# Patient Record
Sex: Male | Born: 1970 | Race: Black or African American | Hispanic: No | Marital: Married
Health system: Northeastern US, Academic
[De-identification: ages and names within clinical notes are randomized; demographics above are authoritative.]

---

## 2018-09-05 IMAGING — US US ABDOMEN COMPLETE
1 series · 14 of 25 positions shown · non-contrast
Comparison: None.

HISTORY: Abdominal pain.
TECHNIQUE: Multiple sonographic images of the abdomen were obtained, complete.

[Series 1: us abdomen complete · 14 of 55 slices shown]
[im 1/55]
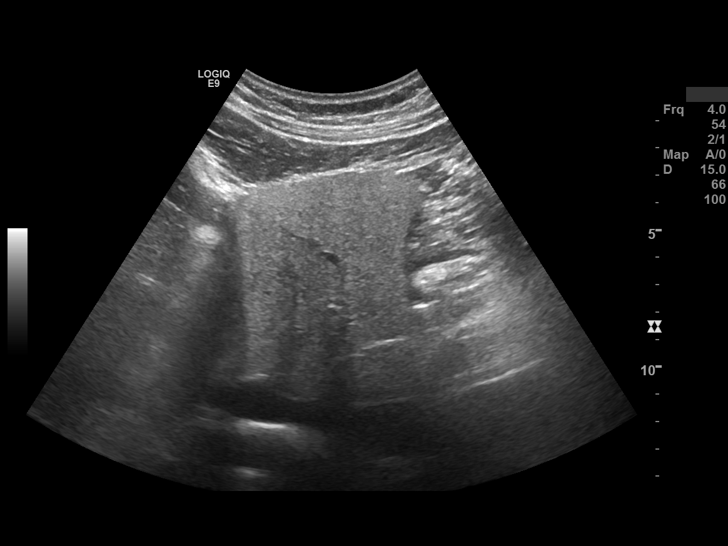
[im 5/55]
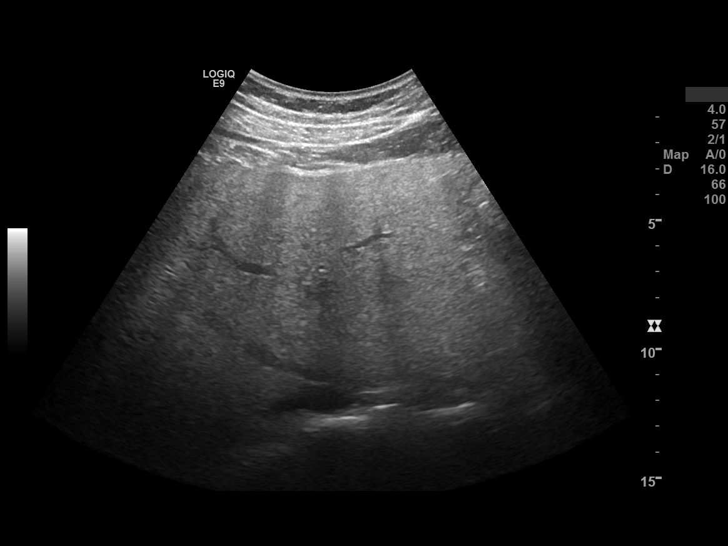
[im 10/55]
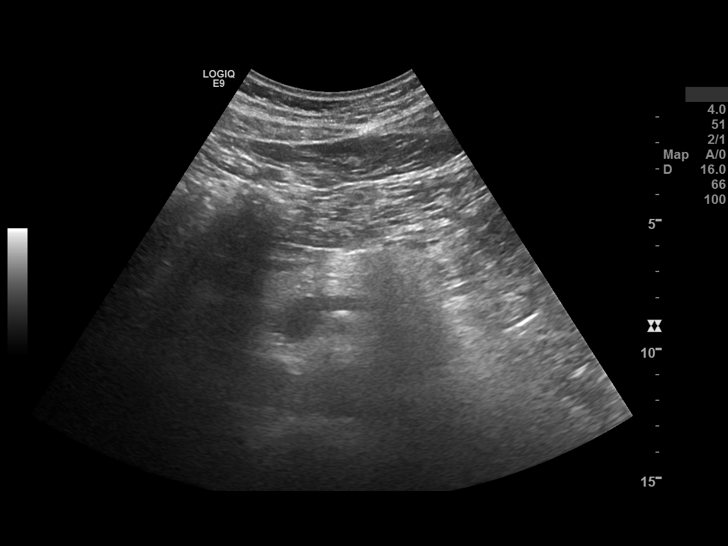
[im 14/55]
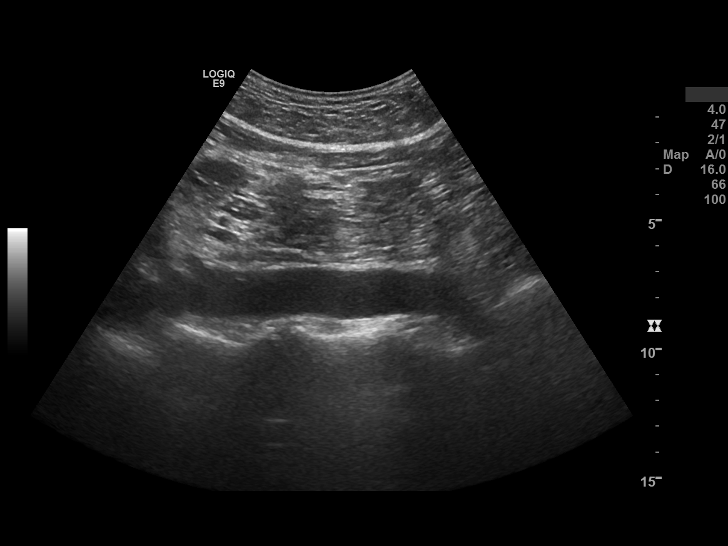
[im 19/55]
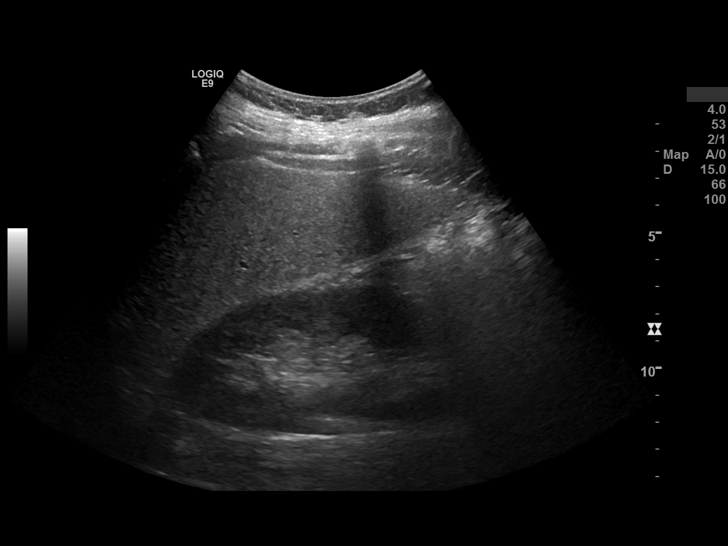
[im 21/55]
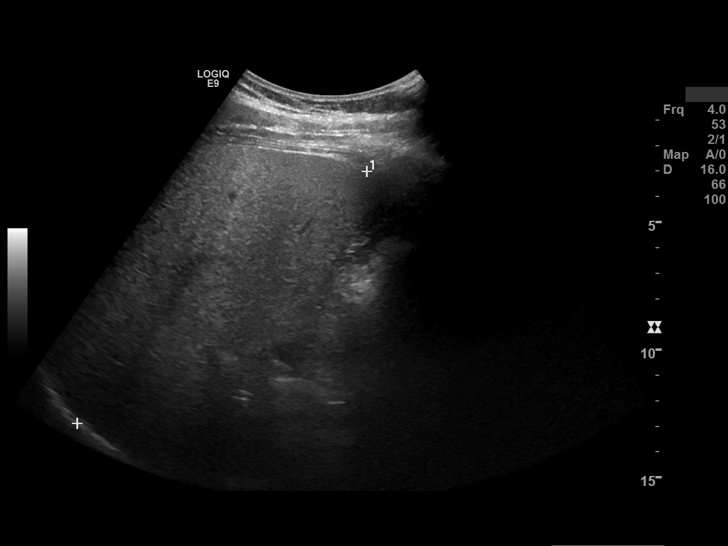
[im 25/55]
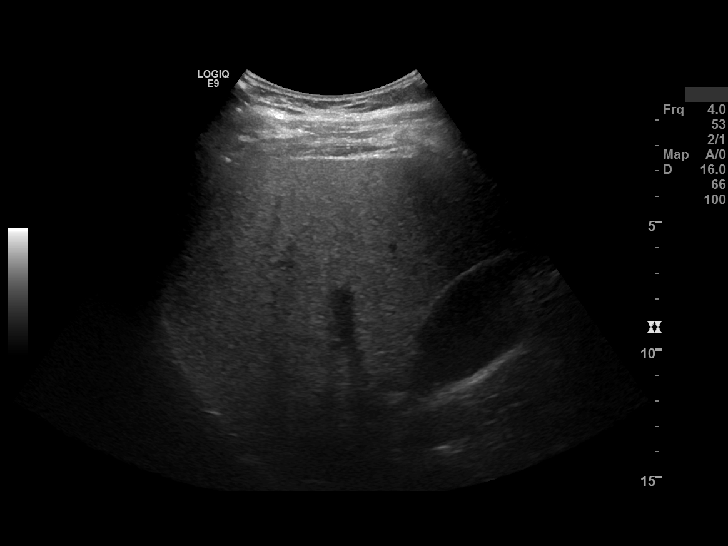
[im 30/55]
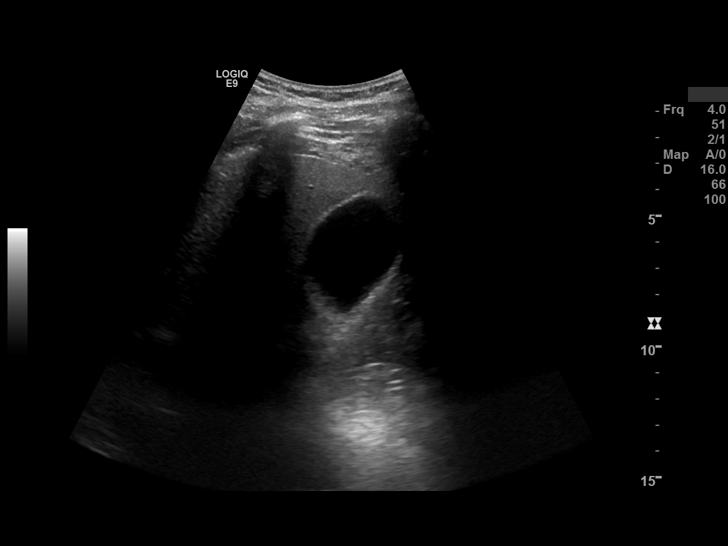
[im 34/55]
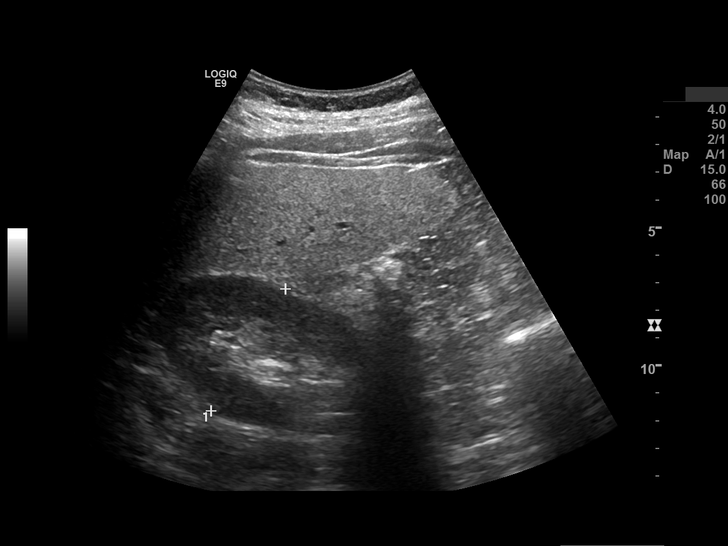
[im 37/55]
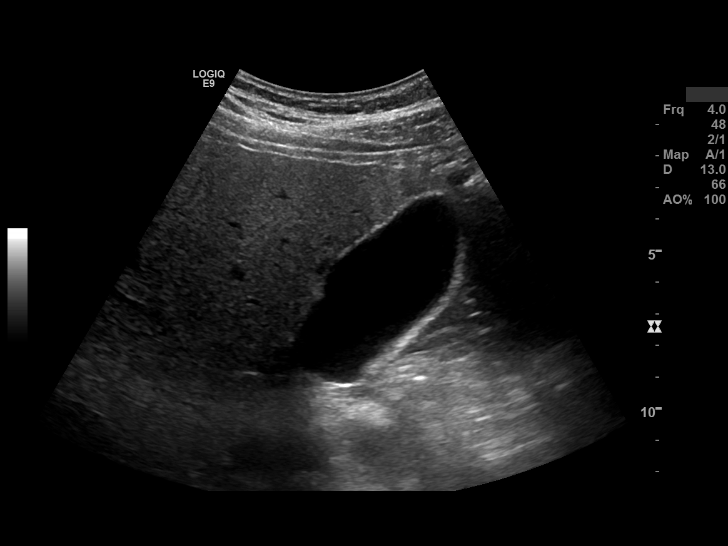
[im 41/55]
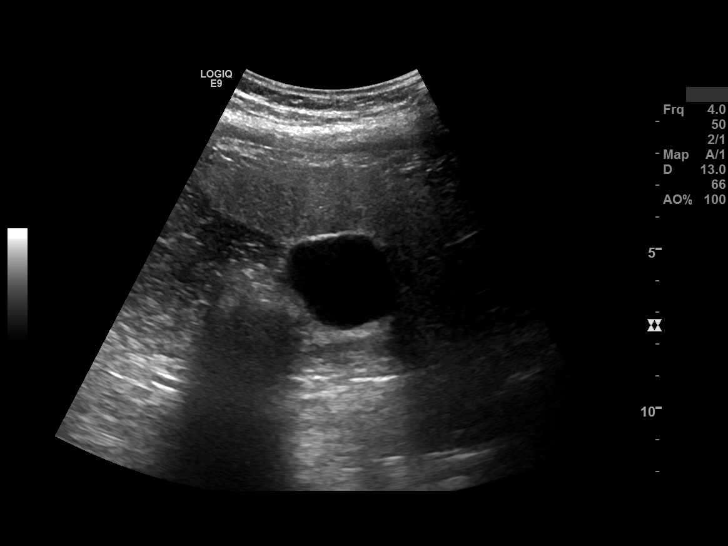
[im 46/55]
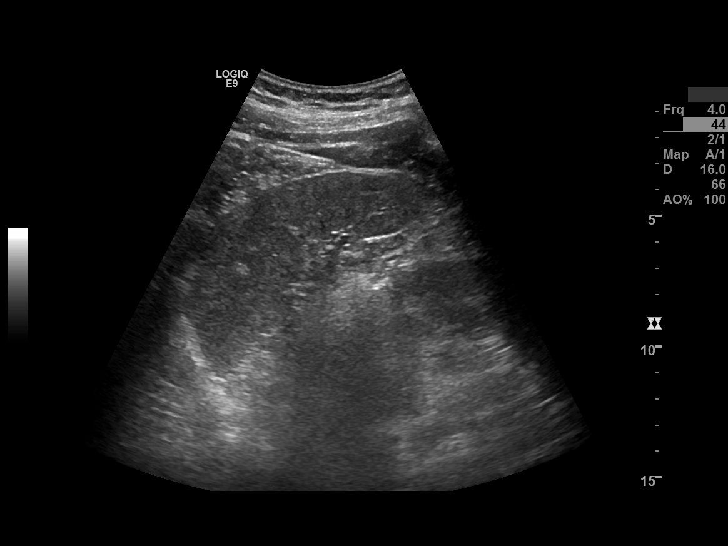
[im 50/55]
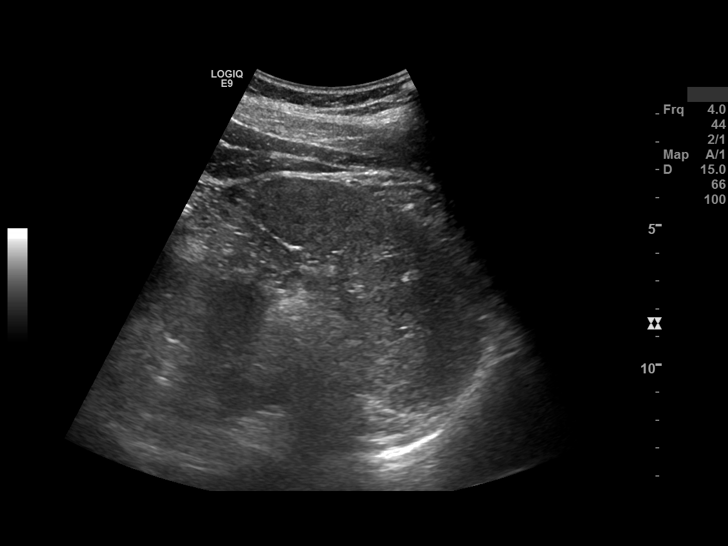
[im 55/55]
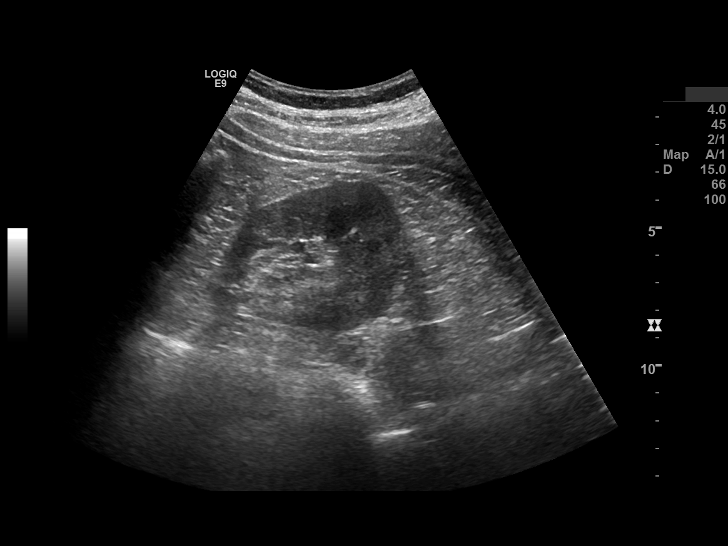

[14 of 25 positions shown; findings below may reference images not displayed]

FINDINGS: Pancreas is unremarkable. Visualized aorta and IVC appear within normal limits. Liver is diffusely hyperechoic with no evidence of mass or intrahepatic biliary ductal dilatation. Normal hepatopetal flow within the portal vein. Gallbladder is unremarkable. Common bile duct measures 0.3 cm, within normal limits. Spleen is unremarkable measuring 9.8 x 4.4 cm. Right kidney measures 12.4 x 6.0 cm. Left kidney measures 11.1 x 5.4 cm. No suspicious renal mass. No hydronephrosis. Single 3.0 cm simple cyst in the right kidney.
IMPRESSION: 1. Hepatic steatosis.

2. Single 3.0 cm simple cyst in the right kidney.

## 2019-01-01 IMAGING — US US SCROTUM W/DOPPLER SCROTUM
1 series · 14 of 25 positions shown · non-contrast
Comparison: None.

HISTORY: Disorder of male genital organs. Bilateral scrotal pain.
TECHNIQUE: High-resolution ultrasound with color-flow Doppler.

[Series 1: us scrotum w/doppler scrotum · 14 of 52 slices shown]
[im 1/52]
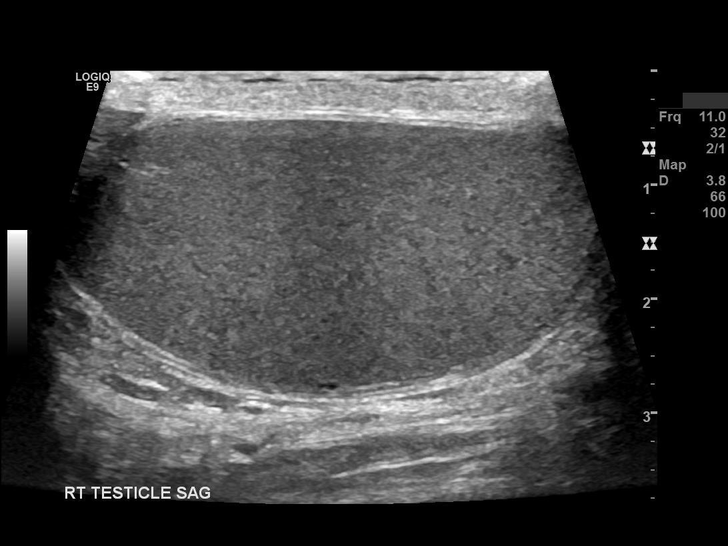
[im 5/52]
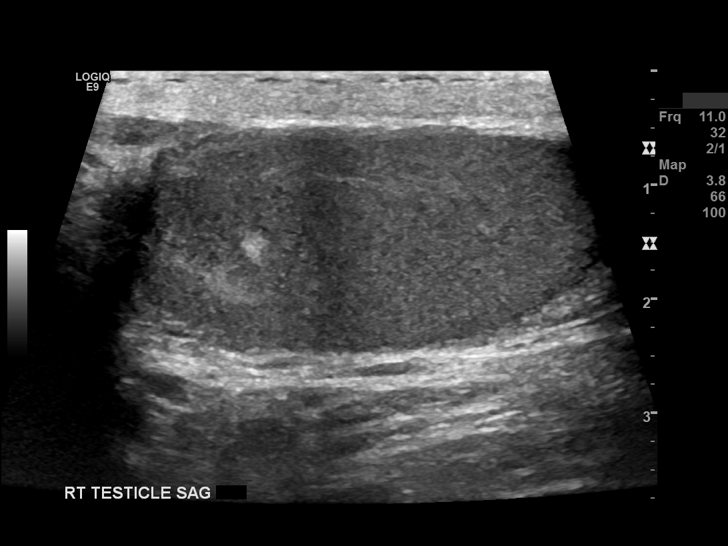
[im 9/52]
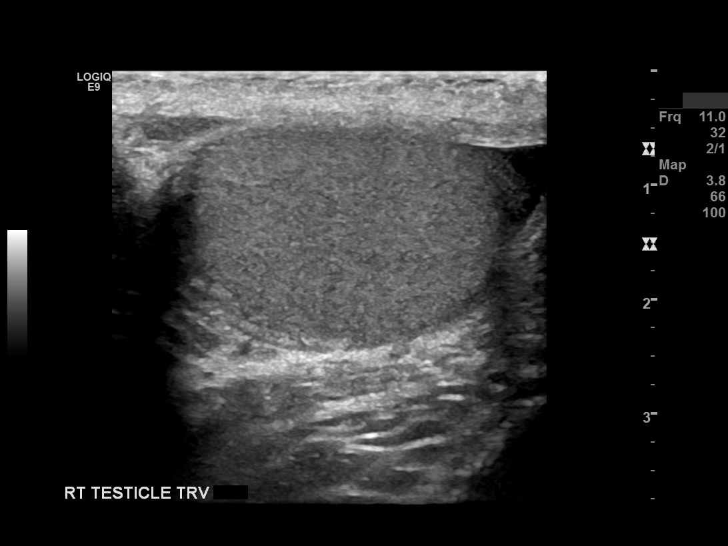
[im 13/52]
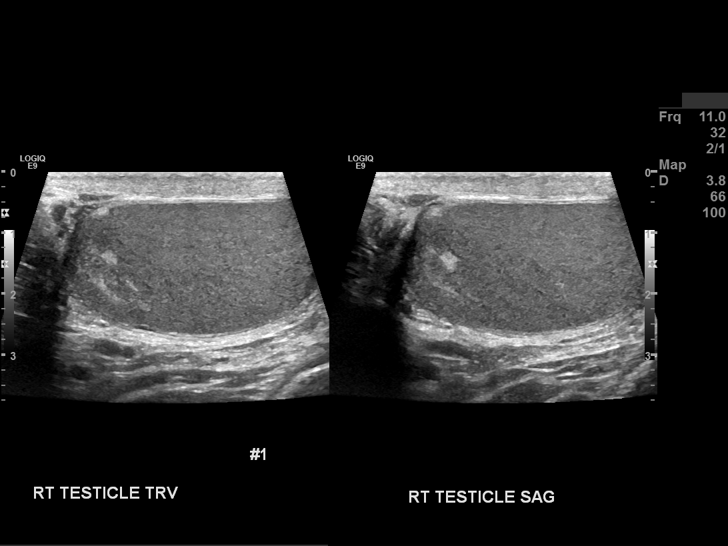
[im 18/52]
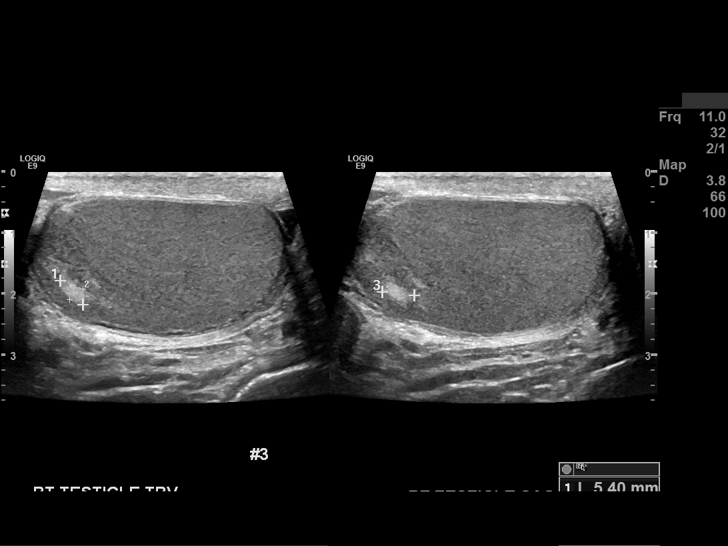
[im 20/52]
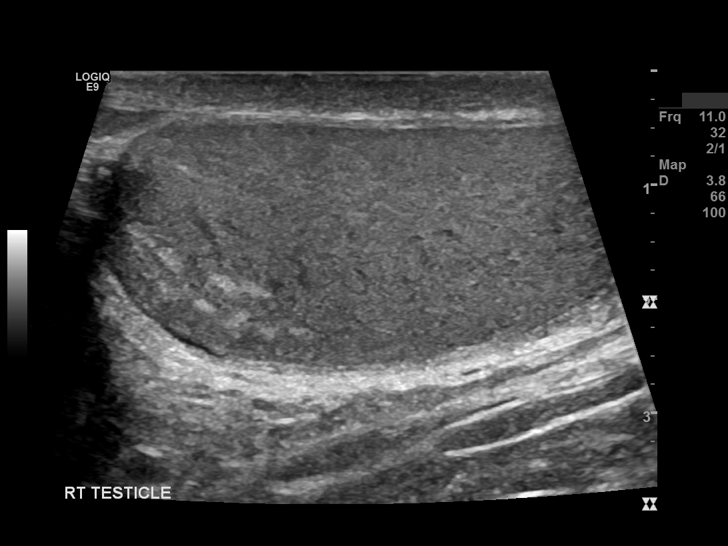
[im 24/52]
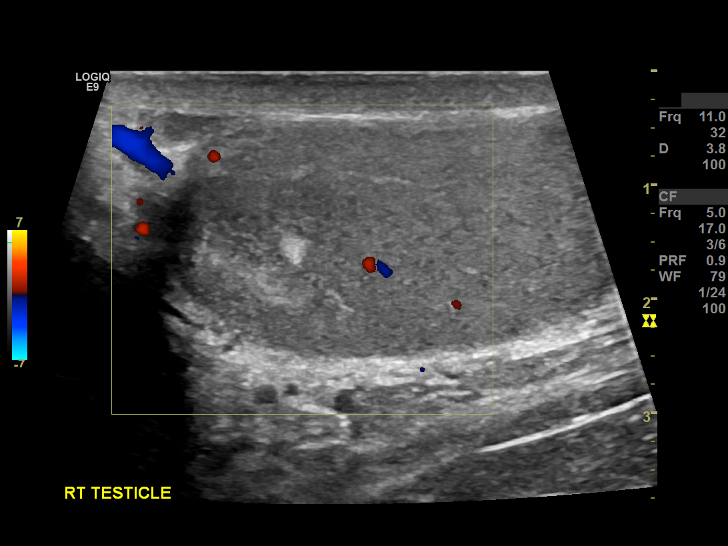
[im 28/52]
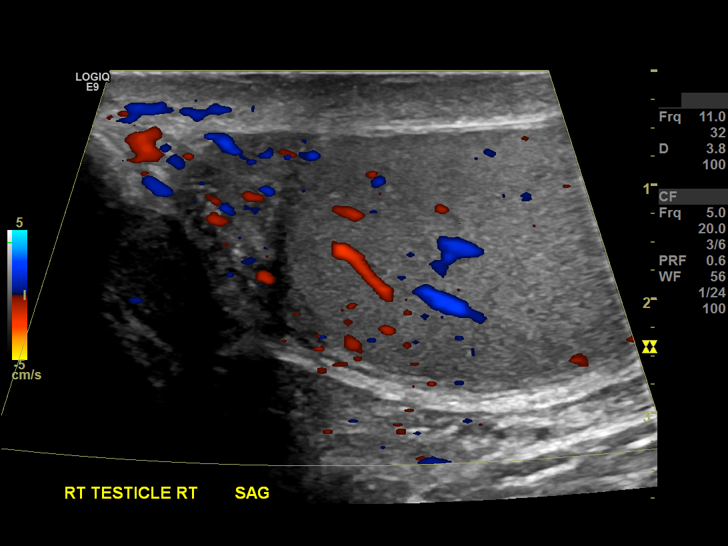
[im 32/52]
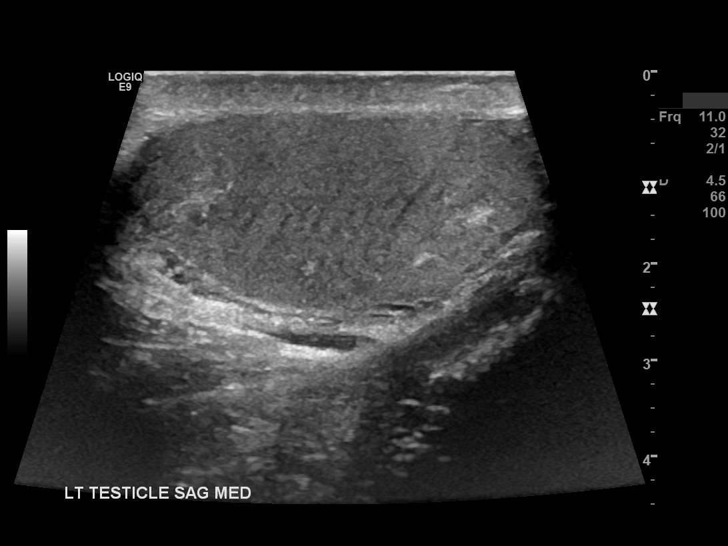
[im 35/52]
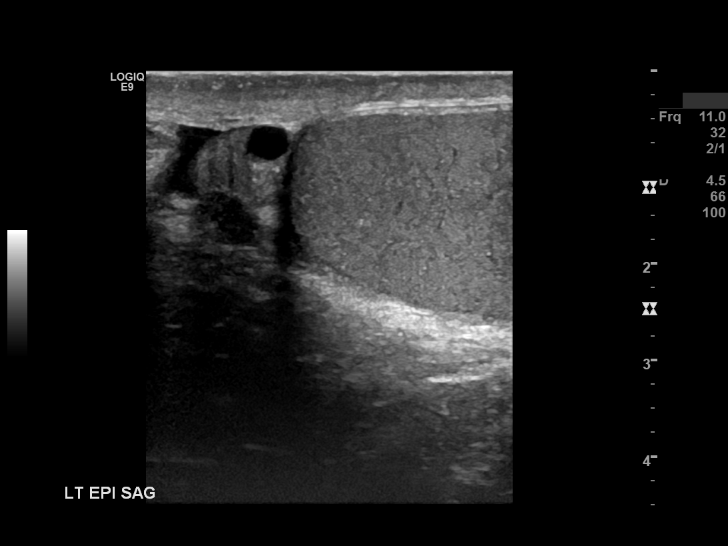
[im 39/52]
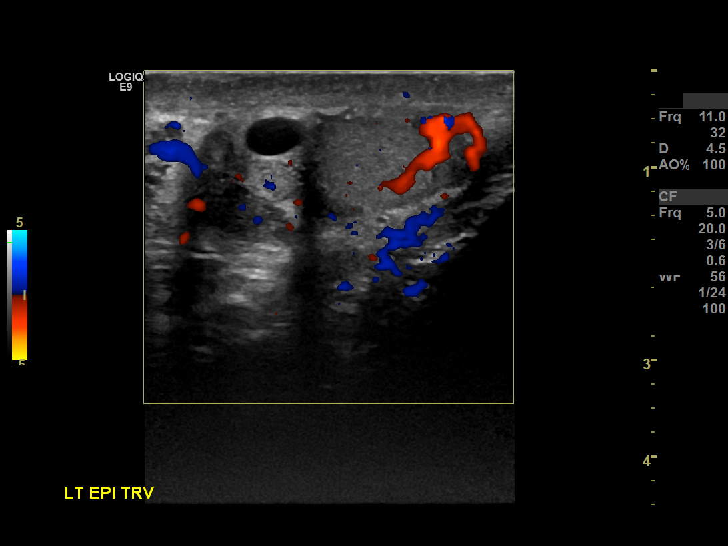
[im 43/52]
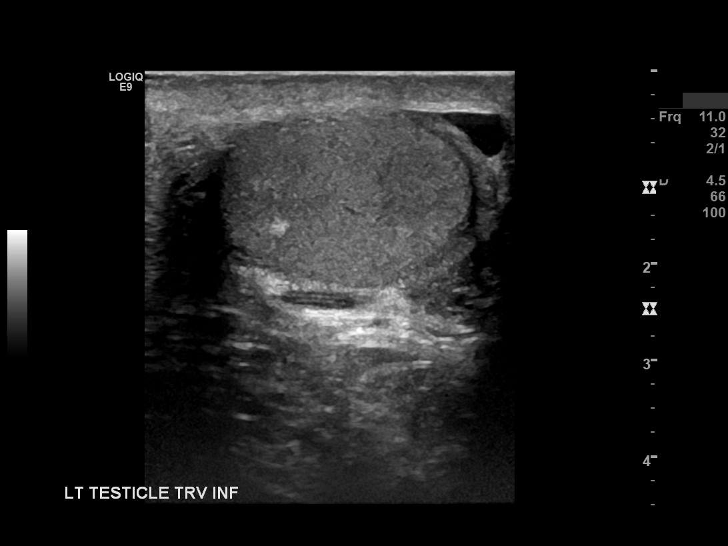
[im 47/52]
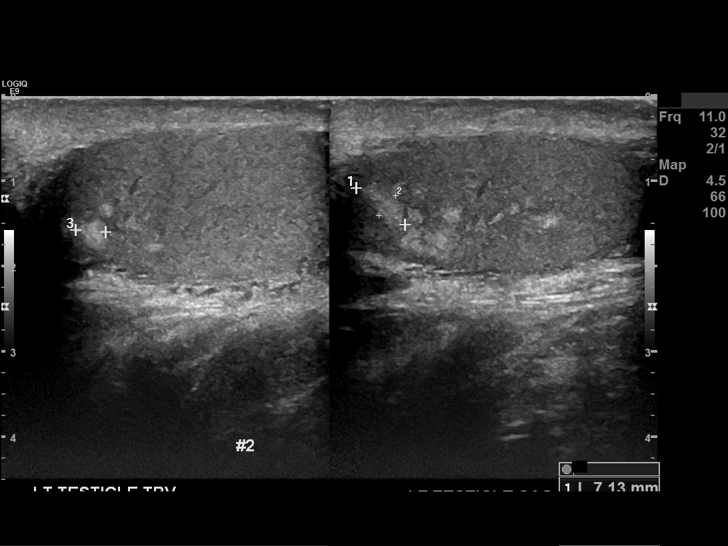
[im 52/52]
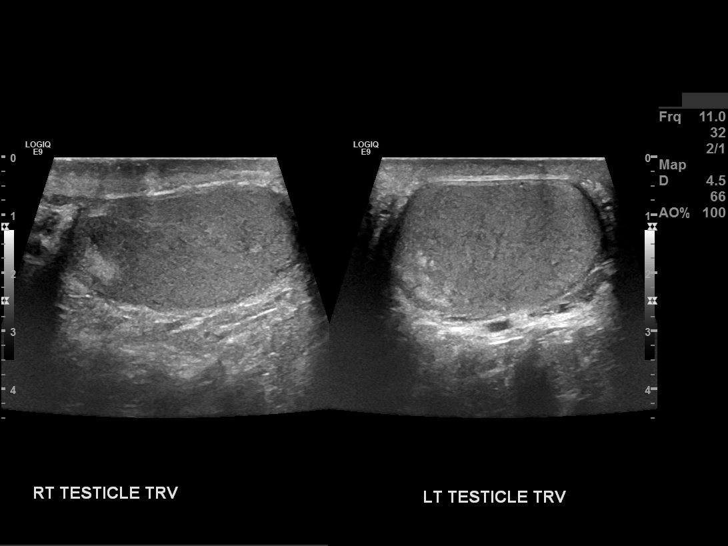

[14 of 25 positions shown; findings below may reference images not displayed]

FINDINGS: Right testicle: 47 x 25 x 40 mm. There is normal arterial and venous blood flow to the testicle. There is no testicular mass. There is heterogenous appearance of the testicular parenchyma with bands of hyperechoic appearance in fairly close proximity to the epididymis. Peak systolic velocity in the right testicular artery is 0.04 m/s. The epididymis is unremarkable except for a couple of small cysts. There is no hydrocele.

Left testicle: 50 x 28 x 31 mm. There is a small cyst in the head of the epididymis. 6 x 4 x 5 mm. There are bands of hyperechogenicity in the left testicle again more prominent in close proximity to the epididymis. There is normal arterial and venous blood flow. Peak systolic velocity in the left testicular artery is 0.04 m/s. There is no hydrocele.
IMPRESSION: 1. Both testicles contain internal heterogenous hyperechoic areas that are most likely related to previous orchitis.

2. There is no evidence of epididymitis or hydrocele on either side.

## 2019-08-13 IMAGING — MR MRI BRAIN W/WO CONTRAST
9 series · 46 of 48 positions shown · IV contrast (prohance)
Comparison: None.

HISTORY: Other disorders of optic nerve, NEC, right eye; unspecified visual loss
TECHNIQUE: Multiplanar, multisequential MRI images of the brain are obtained prior to and following 15 mL ProHance intravenous contrast.

[Series 5: t1_sag · sagittal · 5.0mm · 0.72mm/px · 4 of 25 slices shown]
[im 1/25]
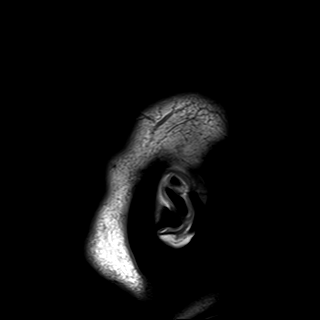
[im 9/25]
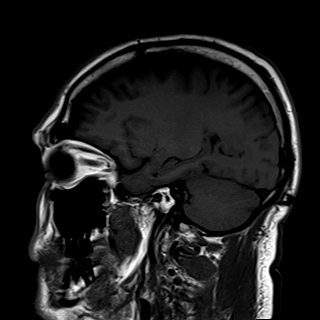
[im 17/25]
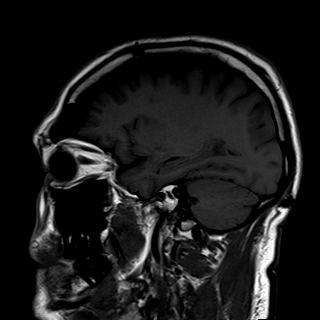
[im 25/25]
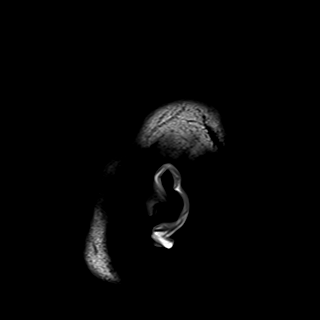

[Series 6: flair_axial_fs · axial · 4.0mm · 0.90mm/px · z∈[-41,+110]mm · 5 of 30 slices shown]
[im 1/30]
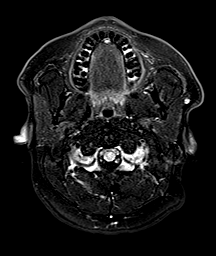
[im 8/30]
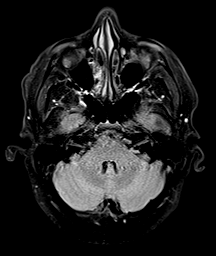
[im 15/30]
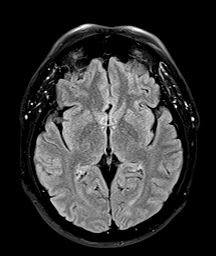
[im 22/30]
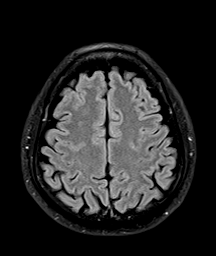
[im 30/30]
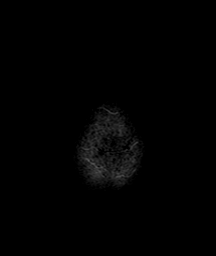

[Series 7: t2_axial · axial · 4.0mm · 0.36mm/px · z∈[-41,+110]mm · 6 of 30 slices shown]
[im 1/30]
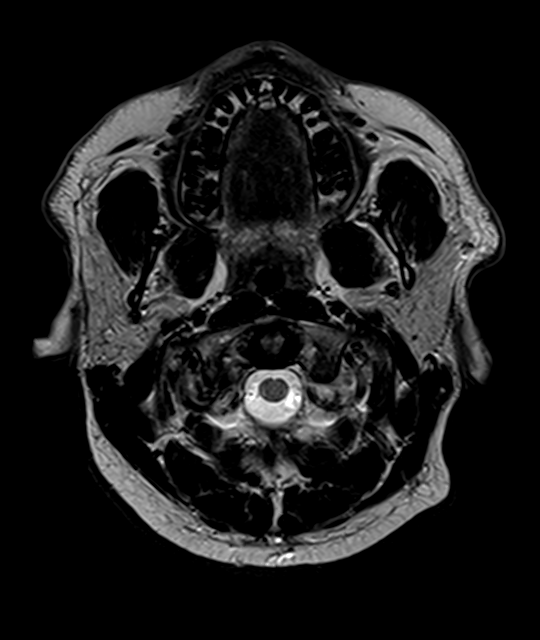
[im 6/30]
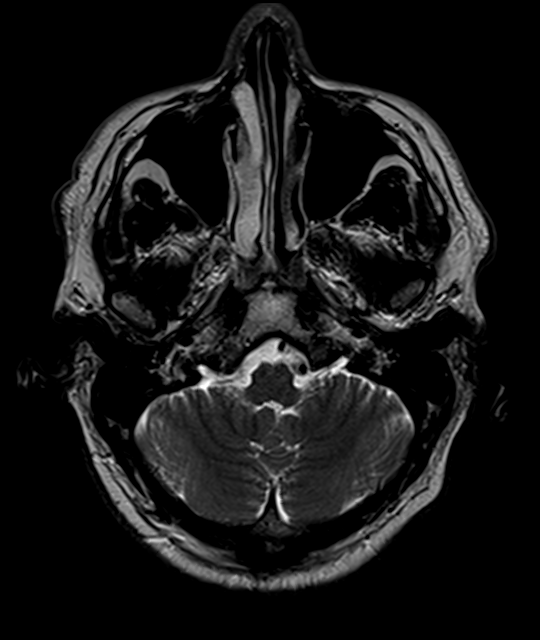
[im 12/30]
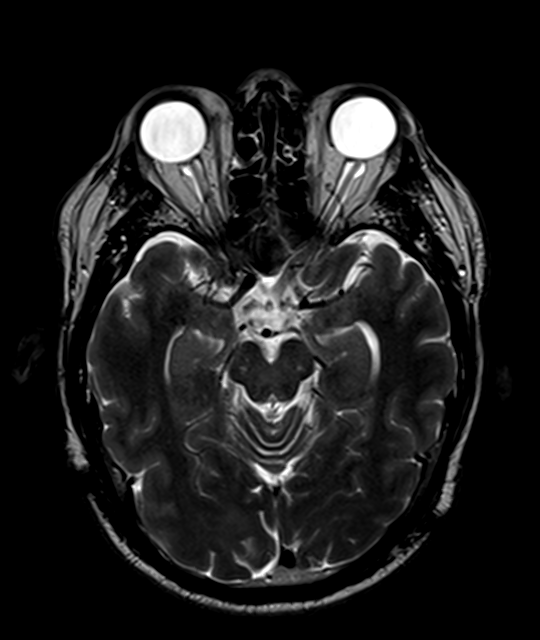
[im 18/30]
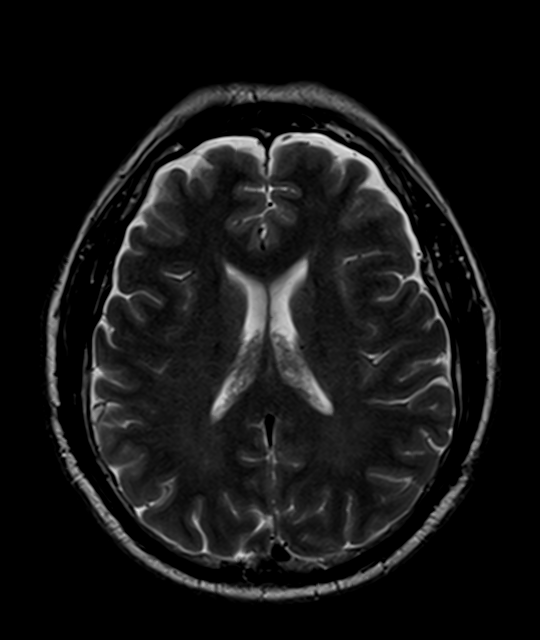
[im 24/30]
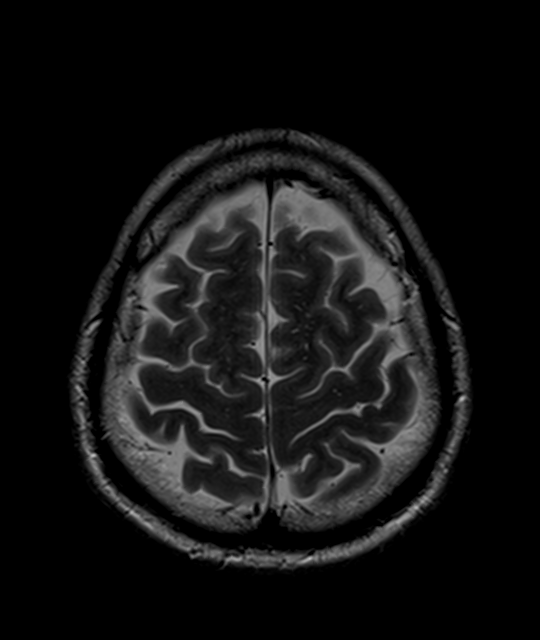
[im 30/30]
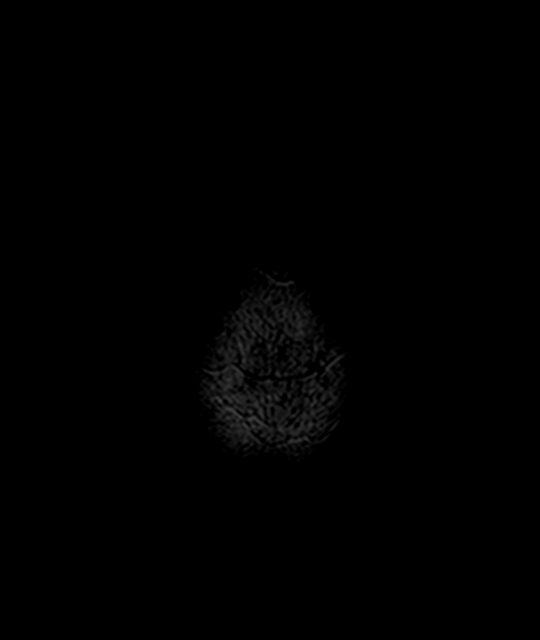

[Series 8: t1_axial · axial · 4.0mm · 0.72mm/px · z∈[-41,+110]mm · 6 of 30 slices shown]
[im 1/30]
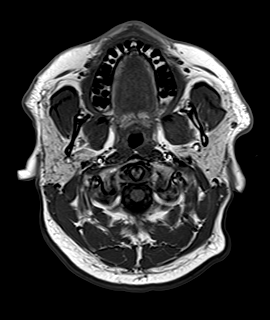
[im 6/30]
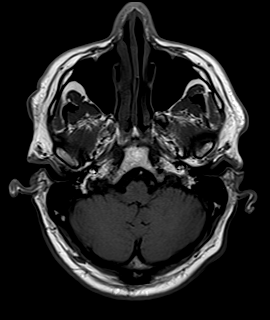
[im 12/30]
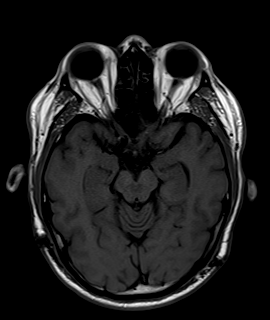
[im 18/30]
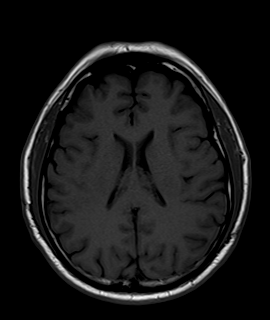
[im 24/30]
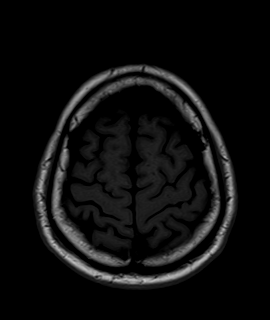
[im 30/30]
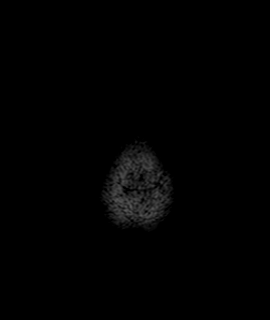

[Series 9: DWI · axial · 4.0mm · 1.31mm/px · z∈[-35,+110]mm · 5 of 29 slices shown (1 of 2)]
[im 1/29]
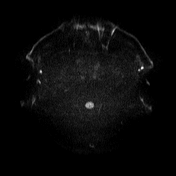
[im 8/29]
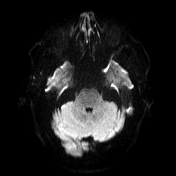
[im 15/29]
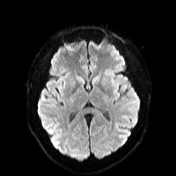
[im 22/29]
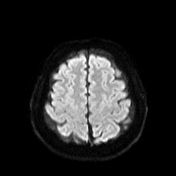
[im 29/29]
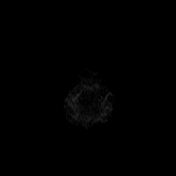

[Series 10: DWI · axial · 4.0mm · 1.31mm/px · z∈[-41,+110]mm · 5 of 29 slices shown (2 of 2)]
[im 1/29]
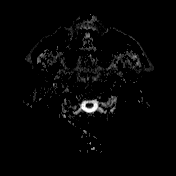
[im 8/29]
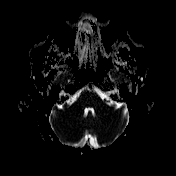
[im 15/29]
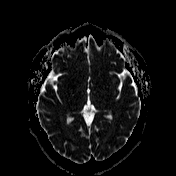
[im 22/29]
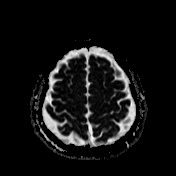
[im 29/29]
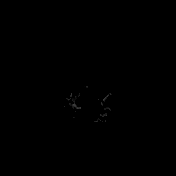

[Series 11: flash_axial · axial · 4.0mm · 0.90mm/px · z∈[-41,+110]mm · 6 of 30 slices shown]
[im 1/30]
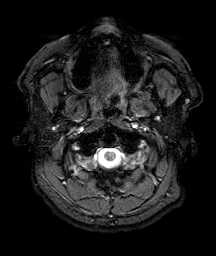
[im 6/30]
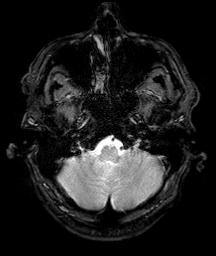
[im 12/30]
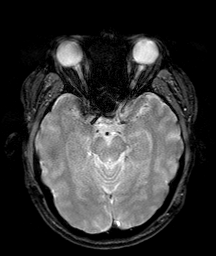
[im 18/30]
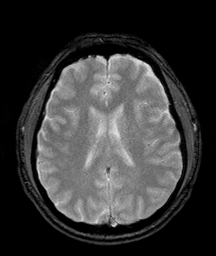
[im 24/30]
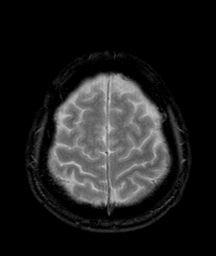
[im 30/30]
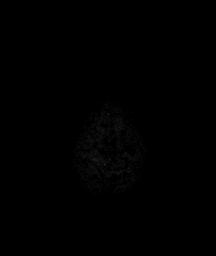

[Series 12: t1_axial+c · axial · 4.0mm · 0.72mm/px · z∈[-41,+110]mm · 6 of 30 slices shown]
[im 1/30]
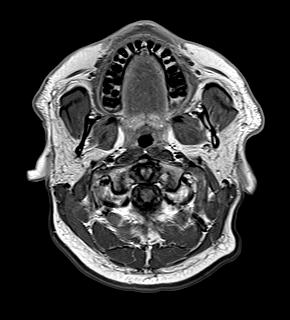
[im 6/30]
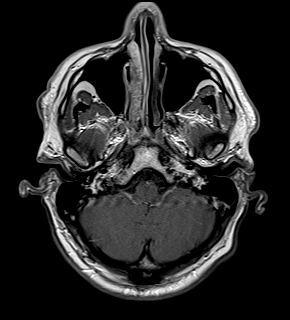
[im 12/30]
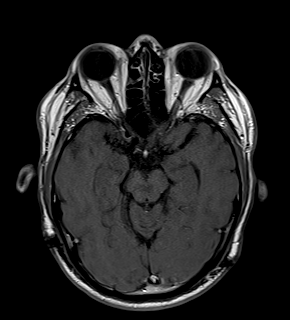
[im 18/30]
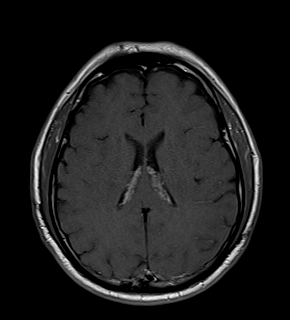
[im 24/30]
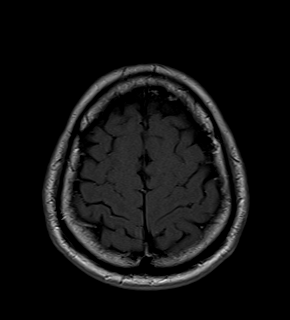
[im 30/30]
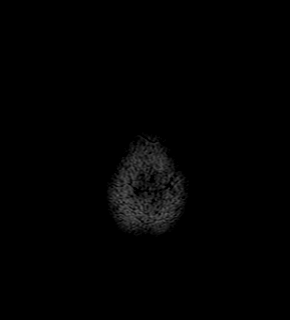

[Series 13: t1_cor_fs_+c · coronal · 5.0mm · 0.72mm/px · 3 of 26 slices shown]
[im 1/26]
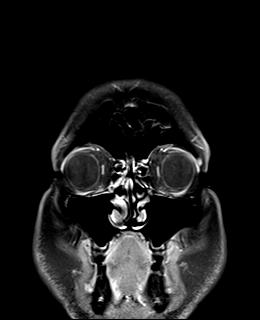
[im 7/26]
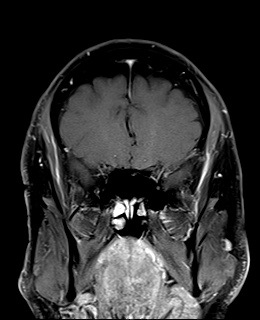
[im 13/26]
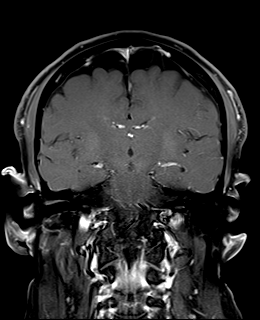

[46 of 48 positions shown; findings below may reference images not displayed]

FINDINGS: There is mild prominence of ventricles and cortical sulci. There is no abnormal intra-axial signal. Midline structures are normal. Flow voids of main intracranial vasculature are normal. There is no mass lesion or midline shift. No extra-axial fluid collection is identified. No area of restricted diffusion, hemosiderin deposit or abnormal enhancement is identified. Mucosal thickening of ethmoid air cells and frontal sinuses seen. The rest of the orbits, paranasal sinuses, mastoid air cells and skull marrow signal are normal. Optic tracts, optic chiasma and optic nerves are grossly unremarkable at the visualized portions.
IMPRESSION: Mild cerebral atrophy or involutional changes identified.

No acute intracranial pathology.

Chronic paranasal sinusitis seen.

## 2019-11-30 ENCOUNTER — Inpatient Hospital Stay: Admit: 2019-11-30 | Discharge: 2019-11-30 | Payer: MEDICAID | Primary: Internal Medicine

## 2019-11-30 ENCOUNTER — Encounter: Admit: 2019-11-30 | Payer: PRIVATE HEALTH INSURANCE | Attending: Internal Medicine | Primary: Internal Medicine

## 2019-11-30 DIAGNOSIS — M79672 Pain in left foot: Secondary | ICD-10-CM

## 2019-12-17 ENCOUNTER — Encounter: Admit: 2019-12-17 | Payer: PRIVATE HEALTH INSURANCE | Attending: Surgery | Primary: Internal Medicine

## 2019-12-17 ENCOUNTER — Ambulatory Visit: Admit: 2019-12-17 | Payer: MEDICAID | Attending: Surgery | Primary: Internal Medicine

## 2019-12-17 DIAGNOSIS — K4020 Bilateral inguinal hernia, without obstruction or gangrene, not specified as recurrent: Secondary | ICD-10-CM

## 2019-12-17 MED ORDER — ATORVASTATIN 10 MG TABLET
10 mg | Freq: Every day | ORAL | Status: AC
Start: 2019-12-17 — End: 2020-02-17

## 2019-12-17 NOTE — H&P
History of Present Illness:  Aaron Irwin is a 49 y.o. male who presents with discomfort and lumps in both groins.  He has noted the discomfort in the groins over the past six weeks.  He is eating well and having normal BMs.  His father is currently being investigated for hernias.  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 Scar   Past Surgical History: Procedure Laterality Date ? KNEE ARTHROCENTESIS    No Known Allergies Social History Socioeconomic History ? Marital status: Married   Spouse name: Not on file ? Number of children: Not on file ? Years of education: Not on file ? Highest education level: Not on file Occupational History ? Not on file Social Needs ? Financial resource strain: Not on file ? Food insecurity   Worry: Not on file   Inability: Not on file ? Transportation needs   Medical: Not on file   Non-medical: Not on file Tobacco Use ? Smoking status: Never Smoker Substance and Sexual Activity ? Alcohol use: Yes   Comment: social ? Drug use: No ? Sexual activity: Not on file Lifestyle ? Physical activity   Days per week: Not on file   Minutes per session: Not on file ? Stress: Not on file Relationships ? Social Manufacturing systems engineer on phone: Not on file   Gets together: Not on file   Attends religious service: Not on file   Active member of club or organization: Not on file   Attends meetings of clubs or organizations: Not on file   Relationship status: Not on file ? Intimate partner violence   Fear of current or ex partner: Not on file   Emotionally abused: Not on file   Physically abused: Not on file   Forced sexual activity: Not on file Other Topics Concern ? Not on file Social History Narrative ? Not on file  Current Outpatient Medications on File Prior to Visit Medication Sig Dispense Refill ? atorvastatin (LIPITOR) 10 mg tablet Take 10 mg by mouth daily.   ? ibuprofen (ADVIL,MOTRIN) 800 MG tablet Take 1 tablet (800 mg total) by mouth every 8 (eight) hours as needed.. 20 tablet 0 No current facility-administered medications on file prior to visit.   Objective:Visit Vitals: There were no vitals taken for this visit.ROS: Complete ten system review of systems performed and documented with pertinent findings documented in the interval history.On Exam: Examination of the head and neck is unremarkableLungs clear to auscultation Cardiac regular rate and rythym, no murmurs Abdomen soft and not tender.  He has lumps in both groins that are reducibleExtremities warm without edemaNeurologically grossly intactImpression:Bilateral inguinal hernias.  I will fix these robotically.  The patient understands the indications for the surgery.  They also understand the risks that include but are not limited to recurrence, infection, hemorrhage, conversion to open, intestinal perforation and they consent.Ashok Pall, MDElectronically Signed by Bobbie Stack, MD, December 17, 2019

## 2019-12-17 NOTE — Progress Notes
History of Present Illness:  Aaron Irwin is a 49 y.o. male who presents with discomfort and lumps in both groins.  He has noted the discomfort in the groins over the past six weeks.  He is eating well and having normal BMs.  His father is currently being investigated for hernias.  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 Scar   Past Surgical History: Procedure Laterality Date ? KNEE ARTHROCENTESIS    No Known Allergies Social History Socioeconomic History ? Marital status: Married   Spouse name: Not on file ? Number of children: Not on file ? Years of education: Not on file ? Highest education level: Not on file Occupational History ? Not on file Social Needs ? Financial resource strain: Not on file ? Food insecurity   Worry: Not on file   Inability: Not on file ? Transportation needs   Medical: Not on file   Non-medical: Not on file Tobacco Use ? Smoking status: Never Smoker Substance and Sexual Activity ? Alcohol use: Yes   Comment: social ? Drug use: No ? Sexual activity: Not on file Lifestyle ? Physical activity   Days per week: Not on file   Minutes per session: Not on file ? Stress: Not on file Relationships ? Social Manufacturing systems engineer on phone: Not on file   Gets together: Not on file   Attends religious service: Not on file   Active member of club or organization: Not on file   Attends meetings of clubs or organizations: Not on file   Relationship status: Not on file ? Intimate partner violence   Fear of current or ex partner: Not on file   Emotionally abused: Not on file   Physically abused: Not on file   Forced sexual activity: Not on file Other Topics Concern ? Not on file Social History Narrative ? Not on file  Current Outpatient Medications on File Prior to Visit Medication Sig Dispense Refill ? atorvastatin (LIPITOR) 10 mg tablet Take 10 mg by mouth daily.   ? ibuprofen (ADVIL,MOTRIN) 800 MG tablet Take 1 tablet (800 mg total) by mouth every 8 (eight) hours as needed.. 20 tablet 0 No current facility-administered medications on file prior to visit.   Objective:Visit Vitals: There were no vitals taken for this visit.ROS: Complete ten system review of systems performed and documented with pertinent findings documented in the interval history.On Exam: Examination of the head and neck is unremarkableLungs clear to auscultation Cardiac regular rate and rythym, no murmurs Abdomen soft and not tender.  He has lumps in both groins that are reducibleExtremities warm without edemaNeurologically grossly intactImpression:Bilateral inguinal hernias.  I will fix these robotically.  The patient understands the indications for the surgery.  They also understand the risks that include but are not limited to recurrence, infection, hemorrhage, conversion to open, intestinal perforation and they consent.Ashok Pall, MDElectronically Signed by Bobbie Stack, MD, December 17, 2019

## 2019-12-30 IMAGING — MR MRI CSPINE WO CONTRAST
5 series · 48 of 48 positions shown · non-contrast
Comparison: None available.

INDICATION: Neck pain
TECHNIQUE: Multiplanar, multisequence imaging of the cervical spine was performed without contrast.

[Series 1: bSSFP · axial · 6.0mm · 1.17mm/px · z∈[-15,+148]mm · 10 of 22 slices shown]
[im 1/22]
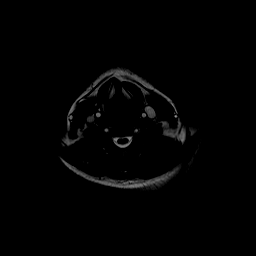
[im 3/22]
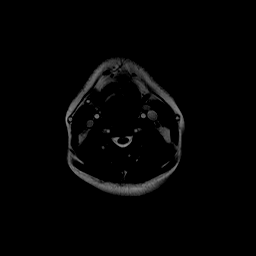
[im 5/22]
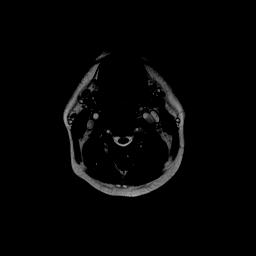
[im 8/22]
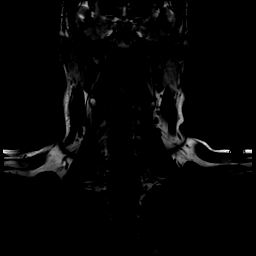
[im 10/22]
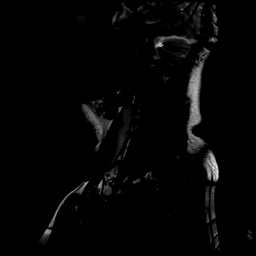
[im 12/22]
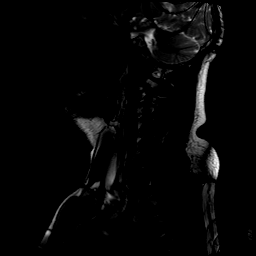
[im 15/22]
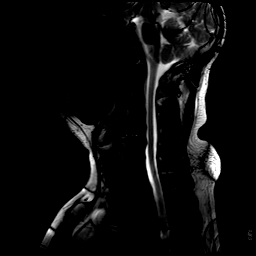
[im 17/22]
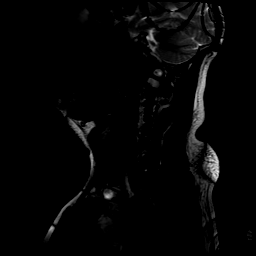
[im 19/22]
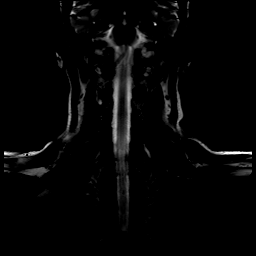
[im 22/22]
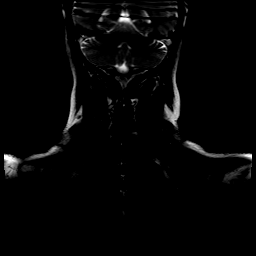

[Series 2: t2_sag · sagittal · 3.0mm · 0.55mm/px · 8 of 15 slices shown]
[im 1/15]
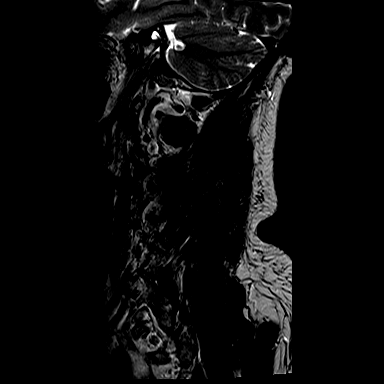
[im 3/15]
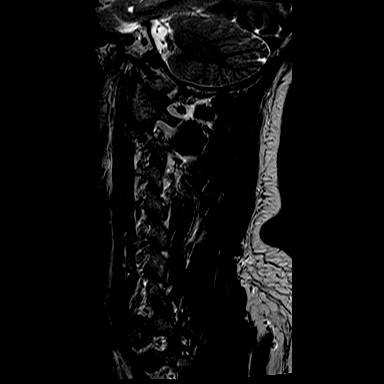
[im 5/15]
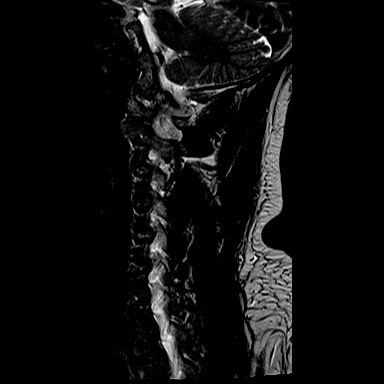
[im 7/15]
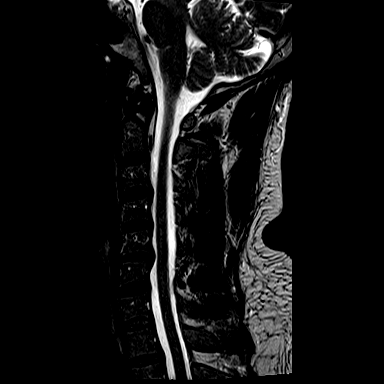
[im 9/15]
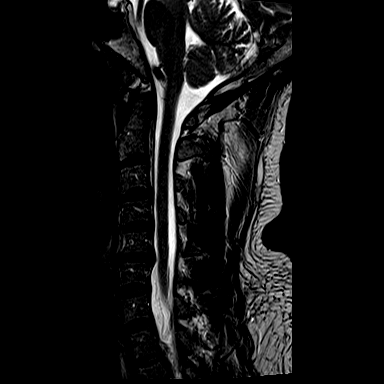
[im 11/15]
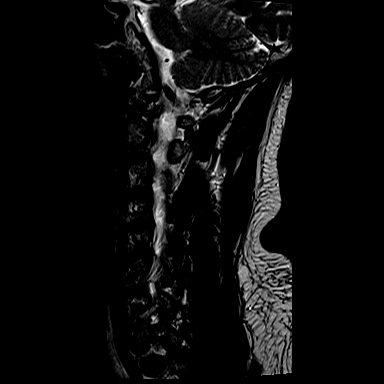
[im 13/15]
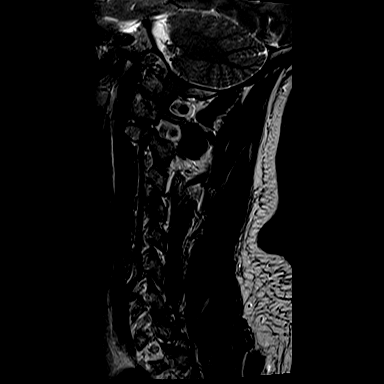
[im 15/15]
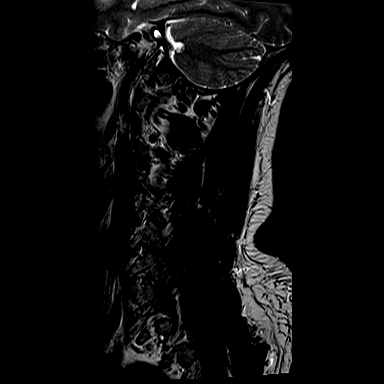

[Series 3: t1_sag · sagittal · 3.0mm · 0.66mm/px · 8 of 15 slices shown]
[im 1/15]
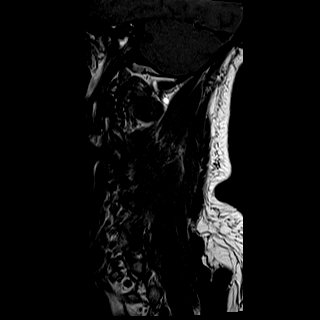
[im 3/15]
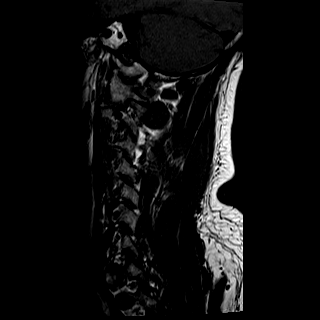
[im 5/15]
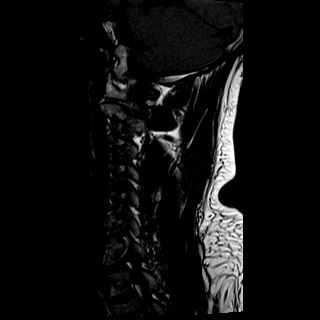
[im 7/15]
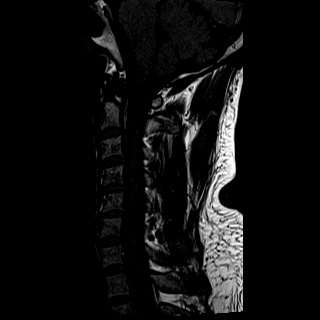
[im 9/15]
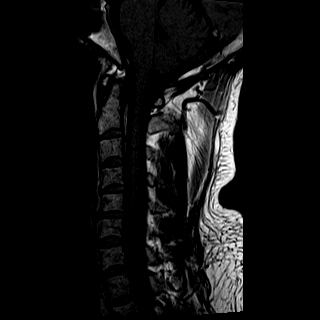
[im 11/15]
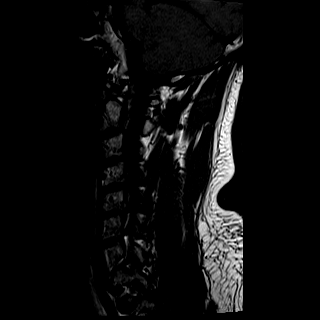
[im 13/15]
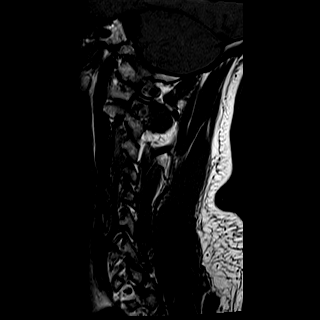
[im 15/15]
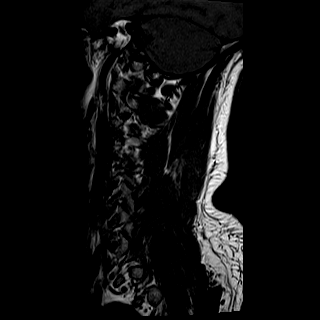

[Series 4: ir_sag · sagittal · 3.0mm · 0.82mm/px · 8 of 15 slices shown]
[im 1/15]
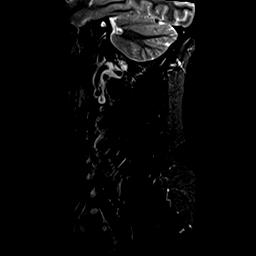
[im 3/15]
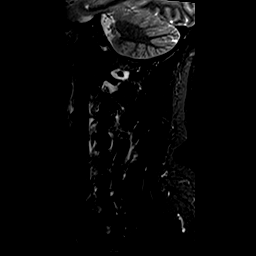
[im 5/15]
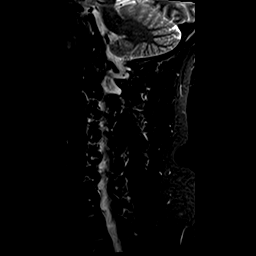
[im 7/15]
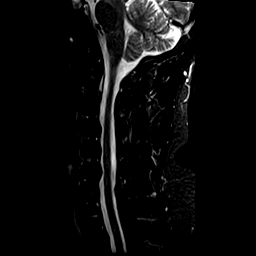
[im 9/15]
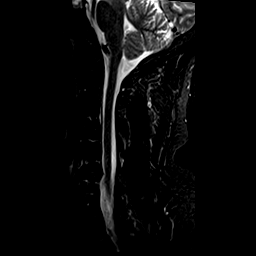
[im 11/15]
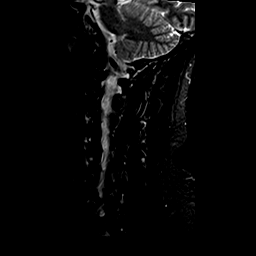
[im 13/15]
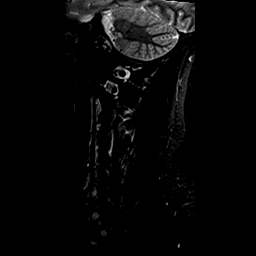
[im 15/15]
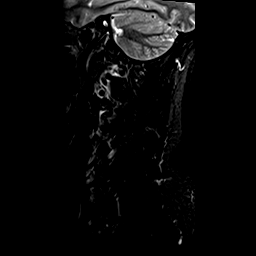

[Series 5: t2_medic_axial · axial · 3.0mm · 0.62mm/px · z∈[-71,+36]mm · 14 of 28 slices shown]
[im 1/28]
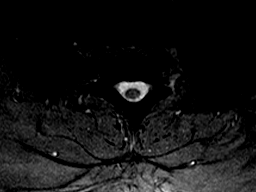
[im 3/28]
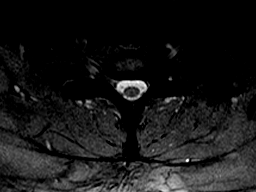
[im 5/28]
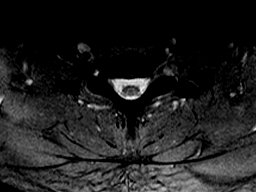
[im 7/28]
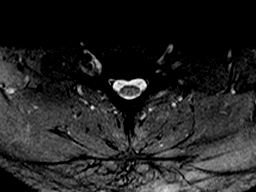
[im 9/28]
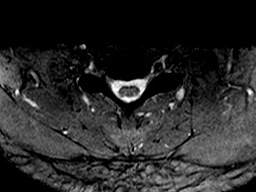
[im 11/28]
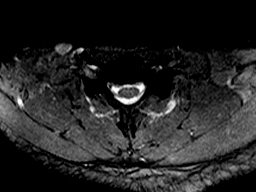
[im 13/28]
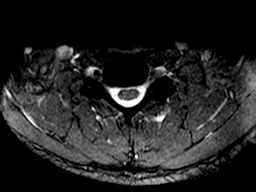
[im 15/28]
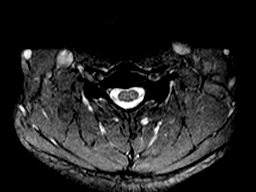
[im 17/28]
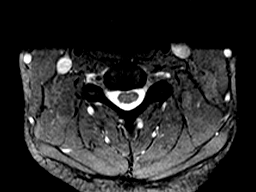
[im 19/28]
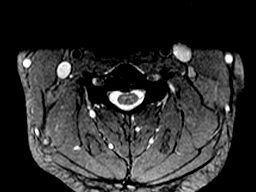
[im 21/28]
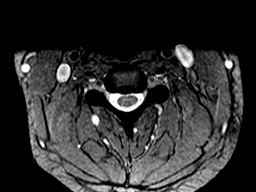
[im 23/28]
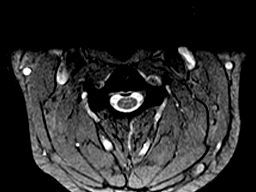
[im 25/28]
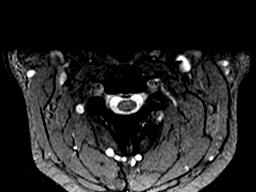
[im 28/28]
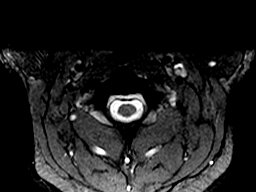

[48 of 48 positions shown; findings below may reference images not displayed]

FINDINGS: Cervical vertebral body heights are well-maintained without MRI evidence of an acute fracture. No aggressive or destructive osseous lesion.

Straightening of cervical lordosis without listhesis.

The cervical spinal cord is normal in size and signal intensity. The visualized posterior fossa structures are unremarkable.

Multilevel, multifactorial degenerative changes of the cervical spine are present with mild disc desiccation at C5-C6.

Additional level by level details as below:

C2-C3: No central spinal canal or neural foraminal stenosis.

C3-C4: No central spinal canal or neural foraminal stenosis.

C4-C5: Minimal disc osteophyte complex. No central spinal canal or neural foraminal stenosis.

C5-C6: Small disc osteophyte complex. Mild uncovertebral and facet joint arthrosis. Contouring of thecal sac without central spinal canal stenosis. Mild right neural foraminal stenosis. The left neural foramen is patent.

C6-C7: Small disc osteophyte complex. No central spinal canal or neural foraminal stenosis.

C7-T1: No central spinal canal or neural foraminal stenosis.

The visualized soft tissues of the neck are unremarkable.
IMPRESSION: 1.
Mild multilevel degenerative changes of the cervical spine without central spinal canal stenosis.

2.
Mild right neural foraminal stenosis at C5-C6.

## 2020-01-04 ENCOUNTER — Encounter: Admit: 2020-01-04 | Payer: PRIVATE HEALTH INSURANCE | Attending: Internal Medicine | Primary: Internal Medicine

## 2020-01-04 DIAGNOSIS — M7918 Myalgia, other site: Secondary | ICD-10-CM

## 2020-01-10 ENCOUNTER — Encounter: Admit: 2020-01-10 | Payer: PRIVATE HEALTH INSURANCE | Attending: Surgery | Primary: Internal Medicine

## 2020-01-14 ENCOUNTER — Ambulatory Visit: Admit: 2020-01-14 | Payer: PRIVATE HEALTH INSURANCE | Attending: Anesthesiology | Primary: Internal Medicine

## 2020-01-14 ENCOUNTER — Encounter: Admit: 2020-01-14 | Payer: PRIVATE HEALTH INSURANCE | Attending: Adult Health | Primary: Internal Medicine

## 2020-01-14 ENCOUNTER — Encounter: Admit: 2020-01-14 | Payer: PRIVATE HEALTH INSURANCE | Attending: Surgery | Primary: Internal Medicine

## 2020-01-14 DIAGNOSIS — L905 Scar conditions and fibrosis of skin: Secondary | ICD-10-CM

## 2020-01-14 DIAGNOSIS — K4020 Bilateral inguinal hernia, without obstruction or gangrene, not specified as recurrent: Secondary | ICD-10-CM

## 2020-01-14 DIAGNOSIS — Z01818 Encounter for other preprocedural examination: Secondary | ICD-10-CM

## 2020-01-14 MED ORDER — UNABLE TO FIND
Status: SS
Start: 2020-01-14 — End: 2020-01-20

## 2020-01-16 ENCOUNTER — Inpatient Hospital Stay: Admit: 2020-01-16 | Discharge: 2020-01-16 | Payer: MEDICAID | Primary: Internal Medicine

## 2020-01-16 DIAGNOSIS — Z01818 Encounter for other preprocedural examination: Secondary | ICD-10-CM

## 2020-01-16 DIAGNOSIS — Z01812 Encounter for preprocedural laboratory examination: Secondary | ICD-10-CM

## 2020-01-16 DIAGNOSIS — Z20822 Contact with and (suspected) exposure to covid-19: Secondary | ICD-10-CM

## 2020-01-17 ENCOUNTER — Inpatient Hospital Stay: Admit: 2020-01-17 | Discharge: 2020-01-17 | Payer: MEDICAID | Primary: Internal Medicine

## 2020-01-17 DIAGNOSIS — M7918 Myalgia, other site: Secondary | ICD-10-CM

## 2020-01-17 LAB — COVID-19 CLEARANCE OR FOR PLACEMENT ONLY: BKR SARS-COV-2 RNA (COVID-19) (YH): NEGATIVE

## 2020-01-18 ENCOUNTER — Encounter: Admit: 2020-01-18 | Payer: PRIVATE HEALTH INSURANCE | Attending: Surgery | Primary: Internal Medicine

## 2020-01-18 DIAGNOSIS — L905 Scar conditions and fibrosis of skin: Secondary | ICD-10-CM

## 2020-01-19 ENCOUNTER — Ambulatory Visit: Admit: 2020-01-19 | Payer: PRIVATE HEALTH INSURANCE | Attending: Anesthesiology | Primary: Internal Medicine

## 2020-01-19 ENCOUNTER — Encounter: Admit: 2020-01-19 | Payer: PRIVATE HEALTH INSURANCE | Attending: Surgery | Primary: Internal Medicine

## 2020-01-19 ENCOUNTER — Inpatient Hospital Stay: Admit: 2020-01-19 | Discharge: 2020-01-19 | Payer: PRIVATE HEALTH INSURANCE | Primary: Internal Medicine

## 2020-01-19 ENCOUNTER — Inpatient Hospital Stay: Admit: 2020-01-19 | Discharge: 2020-01-21 | Payer: MEDICAID | Primary: Internal Medicine

## 2020-01-19 DIAGNOSIS — M25561 Pain in right knee: Secondary | ICD-10-CM

## 2020-01-19 DIAGNOSIS — K4020 Bilateral inguinal hernia, without obstruction or gangrene, not specified as recurrent: Secondary | ICD-10-CM

## 2020-01-19 DIAGNOSIS — L905 Scar conditions and fibrosis of skin: Secondary | ICD-10-CM

## 2020-01-19 LAB — BLOOD GAS, ARTERIAL (BH YH)
BKR BASE EXCESS ARTERIAL: 1 mmol/L (ref ?–2)
BKR HCO3, ARTERIAL: 27.4 mmol/L (ref 21.0–28.0)
BKR O2 SATURATION, ARTERIAL: 100 % — ABNORMAL HIGH (ref 94–98)
BKR PATIENT TEMP: 37 Cel
BKR PCO2 ARTERIAL: 53 mmHg — ABNORMAL HIGH (ref 32–48)
BKR PH, ARTERIAL: 7.33 U — ABNORMAL LOW (ref 7.35–7.45)
BKR PO2, ARTERIAL: 327 mmHg — ABNORMAL HIGH (ref 83–108)

## 2020-01-19 LAB — COMPREHENSIVE METABOLIC PANEL
BKR A/G RATIO: 1.4 (ref 1.0–2.2)
BKR ALBUMIN: 3.9 g/dL (ref 3.6–4.9)
BKR ALKALINE PHOSPHATASE: 53 U/L (ref 9–122)
BKR ANION GAP: 8 (ref 7–17)
BKR BILIRUBIN TOTAL: 0.5 mg/dL (ref <=1.2)
BKR BLOOD UREA NITROGEN: 8 mg/dL (ref 6–20)
BKR BUN / CREAT RATIO: 8.9 (ref 8.0–23.0)
BKR CALCIUM: 8.3 mg/dL — ABNORMAL LOW (ref 8.8–10.2)
BKR CHLORIDE: 100 mmol/L (ref 98–107)
BKR CO2: 26 mmol/L (ref 20–30)
BKR CREATININE: 0.9 mg/dL (ref 0.40–1.30)
BKR EGFR (AFR AMER): >60 mL/min/{1.73_m2} (ref >60)
BKR EGFR (NON AFRICAN AMERICAN): >60 mL/min/{1.73_m2} (ref >60)
BKR GLOBULIN: 2.8 g/dL (ref 2.3–3.5)
BKR GLUCOSE: 178 mg/dL — ABNORMAL HIGH (ref 70–100)
BKR PROTEIN TOTAL: 6.7 g/dL (ref 6.6–8.7)
BKR SODIUM: 134 mmol/L

## 2020-01-19 LAB — CBC WITH AUTO DIFFERENTIAL
BKR WAM ABSOLUTE IMMATURE GRANULOCYTES: 0 x 1000/ÂµL (ref 0.0–0.3)
BKR WAM ABSOLUTE LYMPHOCYTE COUNT: 3.7 x 1000/ÂµL (ref 1.0–4.0)
BKR WAM ABSOLUTE NRBC: 0 x 1000/ÂµL (ref 0.0–0.0)
BKR WAM ANALYZER ANC: 2.9 x 1000/ÂµL (ref 1.0–11.0)
BKR WAM BASOPHIL ABSOLUTE COUNT: 0 x 1000/ÂµL (ref 0.0–0.0)
BKR WAM BASOPHILS: 0.5 % (ref 0.0–4.0)
BKR WAM EOSINOPHIL ABSOLUTE COUNT: 0.1 x 1000/ÂµL (ref 0.0–1.0)
BKR WAM EOSINOPHILS: 1.2 % (ref 0.0–7.0)
BKR WAM HEMATOCRIT: 40.8 % (ref 37.0–52.0)
BKR WAM HEMOGLOBIN: 14.7 g/dL (ref 12.0–18.0)
BKR WAM IMMATURE GRANULOCYTES: 0.1 % (ref 0.0–3.0)
BKR WAM LYMPHOCYTES: 49.8 % — ABNORMAL HIGH (ref 8.0–49.0)
BKR WAM MCH (PG): 30.5 pg (ref 27.0–31.0)
BKR WAM MCHC: 36 g/dL (ref 31.0–36.0)
BKR WAM MCV: 84.6 fL (ref 78.0–94.0)
BKR WAM MONOCYTE ABSOLUTE COUNT: 0.7 x 1000/ÂµL (ref 0.0–2.0)
BKR WAM MONOCYTES: 9.2 % (ref 4.0–15.0)
BKR WAM MPV: 10.7 fL (ref 6.0–11.0)
BKR WAM NEUTROPHILS: 39.2 % (ref 37.0–84.0)
BKR WAM NUCLEATED RED BLOOD CELLS: 0.4 % (ref 0.0–1.0)
BKR WAM PLATELETS: 283 x1000/ÂµL (ref 140–440)
BKR WAM RDW-CV: 12.5 % (ref 11.5–14.5)
BKR WAM RED BLOOD CELL COUNT: 4.8 M/ÂµL (ref 3.8–5.9)
BKR WAM WHITE BLOOD CELL COUNT: 7.6 x1000/ÂµL (ref 4.0–10.0)

## 2020-01-19 LAB — HEPATIC FUNCTION PANEL
BKR ALKALINE PHOSPHATASE: 53 U/L (ref 9–122)
BKR BILIRUBIN TOTAL: 0.4 mg/dL (ref <=1.2)
BKR PROTEIN TOTAL: 6.7 g/dL (ref 6.6–8.7)

## 2020-01-19 LAB — MAGNESIUM: BKR MAGNESIUM: 1.9 mg/dL (ref 1.7–2.4)

## 2020-01-19 LAB — CORTISOL: BKR CORTISOL, PLASMA: 14.1 ug/dL

## 2020-01-19 LAB — ZZZTROPONIN T     (Q)

## 2020-01-19 LAB — PHOSPHORUS     (BH GH L LMW YH): BKR PHOSPHORUS: 3.1 mg/dL (ref 2.2–4.5)

## 2020-01-19 MED ORDER — ONDANSETRON HCL (PF) 4 MG/2 ML INJECTION SOLUTION
42 mg/2 mL | Status: DC
Start: 2020-01-19 — End: 2020-01-21

## 2020-01-19 MED ORDER — SODIUM CHLORIDE 0.9 % IRRIGATION SOLUTION
0.9 % irrigation | Status: CP | PRN
Start: 2020-01-19 — End: ?
  Administered 2020-01-19: 11:00:00 0.9 % irrigation

## 2020-01-19 MED ORDER — PROPOFOL 10 MG/ML INTRAVENOUS EMULSION
10 mg/mL | Status: CP
Start: 2020-01-19 — End: ?

## 2020-01-19 MED ORDER — FENTANYL (PF) 50 MCG/ML INJECTION SOLUTION
50 mcg/mL | INTRAVENOUS | Status: DC | PRN
Start: 2020-01-19 — End: 2020-01-19
  Administered 2020-01-19 (×4): 50 mcg/mL via INTRAVENOUS

## 2020-01-19 MED ORDER — ONDANSETRON HCL (PF) 4 MG/2 ML INJECTION SOLUTION
42 mg/2 mL | Freq: Four times a day (QID) | INTRAVENOUS | Status: DC | PRN
Start: 2020-01-19 — End: 2020-01-21
  Administered 2020-01-19: 22:00:00 4 mL via INTRAVENOUS

## 2020-01-19 MED ORDER — ONDANSETRON HCL (PF) 4 MG/2 ML INJECTION SOLUTION
4 mg/2 mL | Status: DC | PRN
Start: 2020-01-19 — End: 2020-01-19
  Administered 2020-01-19: 14:00:00 4 mg/2 mL via INTRAVENOUS

## 2020-01-19 MED ORDER — MIDAZOLAM (PF) 1 MG/ML INJECTION SOLUTION
1 mg/mL | Status: DC | PRN
Start: 2020-01-19 — End: 2020-01-19
  Administered 2020-01-19 (×2): 1 mg/mL via INTRAVENOUS

## 2020-01-19 MED ORDER — FAT EMULSION 20 % (DRUG TOXICITY)
20 % | Freq: Once | INTRAVENOUS | Status: CP
Start: 2020-01-19 — End: ?
  Administered 2020-01-19: 12:00:00 20 mL/h via INTRAVENOUS

## 2020-01-19 MED ORDER — MEPERIDINE 25 MG/2.5 ML IN 0.9% SODIUM CHLORIDE
INTRAVENOUS | Status: DC | PRN
Start: 2020-01-19 — End: 2020-01-19

## 2020-01-19 MED ORDER — EPHEDRINE SULFATE 50 MG/ML INTRAVENOUS SOLUTION
50 mg/mL | Status: DC | PRN
Start: 2020-01-19 — End: 2020-01-19
  Administered 2020-01-19: 12:00:00 50 mg/mL via INTRAMUSCULAR

## 2020-01-19 MED ORDER — GLYCOPYRROLATE 0.2 MG/ML INJECTION SOLUTION
0.2 mg/mL | Status: DC | PRN
Start: 2020-01-19 — End: 2020-01-19
  Administered 2020-01-19: 12:00:00 0.2 mg/mL via INTRAVENOUS

## 2020-01-19 MED ORDER — FENTANYL (PF) 50 MCG/ML INJECTION SOLUTION
50 mcg/mL | Status: CP
Start: 2020-01-19 — End: ?

## 2020-01-19 MED ORDER — ETOMIDATE 2 MG/ML INTRAVENOUS SOLUTION
2 mg/mL | Status: DC | PRN
Start: 2020-01-19 — End: 2020-01-19
  Administered 2020-01-19: 12:00:00 2 mg/mL via INTRAVENOUS

## 2020-01-19 MED ORDER — ROCURONIUM 10 MG/ML INTRAVENOUS SOLUTION
10 mg/mL | Status: DC | PRN
Start: 2020-01-19 — End: 2020-01-19
  Administered 2020-01-19: 12:00:00 10 mg/mL via INTRAVENOUS

## 2020-01-19 MED ORDER — MIDAZOLAM (PF) 1 MG/ML INJECTION SOLUTION
1 mg/mL | Status: CP
Start: 2020-01-19 — End: ?

## 2020-01-19 MED ORDER — SODIUM CHLORIDE 0.9 % (FLUSH) INJECTION SYRINGE
0.9 % | INTRAVENOUS | Status: DC | PRN
Start: 2020-01-19 — End: 2020-01-19

## 2020-01-19 MED ORDER — OXYCODONE IMMEDIATE RELEASE 5 MG TABLET
5 mg | ORAL | Status: AC | PRN
Start: 2020-01-19 — End: ?

## 2020-01-19 MED ORDER — FENTANYL (PF) 50 MCG/ML INJECTION SOLUTION
50 mcg/mL | INTRAVENOUS | Status: DC | PRN
Start: 2020-01-19 — End: 2020-01-19

## 2020-01-19 MED ORDER — ACETAMINOPHEN 1,000 MG/100 ML (10 MG/ML) INTRAVENOUS SOLUTION
10 mg/mL | Status: DC | PRN
Start: 2020-01-19 — End: 2020-01-19
  Administered 2020-01-19: 13:00:00 10 mg/mL via INTRAVENOUS

## 2020-01-19 MED ORDER — LIDOCAINE (PF) 20 MG/ML (2 %) INTRAVENOUS SOLUTION
20 mg/mL (2 %) | Status: CP
Start: 2020-01-19 — End: ?

## 2020-01-19 MED ORDER — DEXAMETHASONE SODIUM PHOSPHATE 4 MG/ML INJECTION SOLUTION
4 mg/mL | Status: CP
Start: 2020-01-19 — End: ?

## 2020-01-19 MED ORDER — NALOXONE 0.4 MG/ML INJECTION SOLUTION
0.4 mg/mL | INTRAVENOUS | Status: DC | PRN
Start: 2020-01-19 — End: 2020-01-19

## 2020-01-19 MED ORDER — METHYLPREDNISOLONE ACETATE 40 MG/ML SUSPENSION FOR INJECTION
40 mg/mL | Status: DC | PRN
Start: 2020-01-19 — End: 2020-01-19
  Administered 2020-01-19 (×2): 40 mg/mL

## 2020-01-19 MED ORDER — ROCURONIUM 10 MG/ML INTRAVENOUS SOLUTION
10 mg/mL | Status: CP
Start: 2020-01-19 — End: ?

## 2020-01-19 MED ORDER — BUPIVACAINE (PF) 0.5 % (5 MG/ML) INJECTION SOLUTION
0.5 % (5 mg/mL) | Status: DC | PRN
Start: 2020-01-19 — End: 2020-01-19
  Administered 2020-01-19 (×4): 0.5 % (5 mg/mL) via PERINEURAL

## 2020-01-19 MED ORDER — ROPIVACAINE (PF) 2 MG/ML (0.2 %) INJECTION SOLUTION
2 mg/mL (0. %) | PERINEURAL | Status: DC | PRN
Start: 2020-01-19 — End: 2020-01-19

## 2020-01-19 MED ORDER — DEXAMETHASONE SODIUM PHOSPHATE 4 MG/ML INJECTION SOLUTION
4 mg/mL | Status: DC | PRN
Start: 2020-01-19 — End: 2020-01-19
  Administered 2020-01-19: 12:00:00 4 mg/mL via INTRAVENOUS

## 2020-01-19 MED ORDER — ACETAMINOPHEN 325 MG TABLET
325 mg | Freq: Four times a day (QID) | ORAL | Status: DC | PRN
Start: 2020-01-19 — End: 2020-01-21

## 2020-01-19 MED ORDER — POLYETHYLENE GLYCOL 3350 17 GRAM ORAL POWDER PACKET
17 gram | Freq: Every day | ORAL | Status: DC
Start: 2020-01-19 — End: 2020-01-21
  Administered 2020-01-20: 02:00:00 17 gram via ORAL

## 2020-01-19 MED ORDER — SODIUM CHLORIDE 0.9 % (FLUSH) INJECTION SYRINGE
0.9 % | Freq: Three times a day (TID) | INTRAVENOUS | Status: DC
Start: 2020-01-19 — End: 2020-01-19

## 2020-01-19 MED ORDER — SENNOSIDES 8.6 MG-DOCUSATE SODIUM 50 MG TABLET
8.6-50 mg | Freq: Every evening | ORAL | Status: DC
Start: 2020-01-19 — End: 2020-01-20

## 2020-01-19 MED ORDER — DEXAMETHASONE SODIUM PHOSPHATE (PF) 10 MG/ML INJECTION SOLUTION
10 mg/mL | Status: DC | PRN
Start: 2020-01-19 — End: 2020-01-19
  Administered 2020-01-19 (×2): 10 mg/mL via PERINEURAL

## 2020-01-19 MED ORDER — ONDANSETRON HCL (PF) 4 MG/2 ML INJECTION SOLUTION
4 mg/2 mL | INTRAVENOUS | Status: DC | PRN
Start: 2020-01-19 — End: 2020-01-19
  Administered 2020-01-19: 17:00:00 4 mL via INTRAVENOUS

## 2020-01-19 MED ORDER — LACTATED RINGERS INTRAVENOUS SOLUTION
INTRAVENOUS | Status: DC | PRN
Start: 2020-01-19 — End: 2020-01-19
  Administered 2020-01-19 (×2): via INTRAVENOUS

## 2020-01-19 MED ORDER — SUGAMMADEX 100 MG/ML INTRAVENOUS SOLUTION
100 mg/mL | Status: DC | PRN
Start: 2020-01-19 — End: 2020-01-19
  Administered 2020-01-19: 15:00:00 100 mg/mL via INTRAVENOUS

## 2020-01-19 MED ORDER — EPHEDRINE (PF) 25 MG/5 ML (5 MG/ML) IN 0.9% SODIUM CHLORIDE IV SYRINGE
25 mg/5 mL (5 mg/mL) | Status: DC | PRN
Start: 2020-01-19 — End: 2020-01-19
  Administered 2020-01-19 (×2): 25 mg/5 mL (5 mg/mL) via INTRAVENOUS

## 2020-01-19 MED ORDER — FAT EMULSION 20 % (DRUG TOXICITY)
20 % | Freq: Once | INTRAVENOUS | Status: CP
Start: 2020-01-19 — End: ?
  Administered 2020-01-19 (×2): 20 mL via INTRAVENOUS

## 2020-01-19 MED ORDER — TAMSULOSIN 0.4 MG CAPSULE
0.4 mg | Freq: Every evening | ORAL | Status: DC
Start: 2020-01-19 — End: 2020-01-21
  Administered 2020-01-20 – 2020-01-21 (×2): 0.4 mg via ORAL

## 2020-01-19 MED ORDER — SENNOSIDES 8.6 MG-DOCUSATE SODIUM 50 MG TABLET
8.6-50 mg | Freq: Every evening | ORAL | Status: DC
Start: 2020-01-19 — End: 2020-01-21
  Administered 2020-01-20 – 2020-01-21 (×2): via ORAL

## 2020-01-19 MED ORDER — CEFAZOLIN 1 GRAM SOLUTION FOR INJECTION
1 gram | Status: CP
Start: 2020-01-19 — End: ?

## 2020-01-19 MED ORDER — HYDROMORPHONE 2 MG/ML INJECTION SOLUTION
2 mg/mL | INTRAVENOUS | Status: DC | PRN
Start: 2020-01-19 — End: 2020-01-19

## 2020-01-19 NOTE — Plan of Care
PACU to Floor Nursing Transfer NotePreop Diagnosis: Non-recurrent bilateral inguinal hernia without obstruction or gangrene [K40.20]Procedure Done: bilateral robotic inguinal hernia repair Any Significant Events Intra-Op: noneAbnormal Assessment in PACU: pt somnolent but arousable Level of Consciousness: responds to stimulationLast Set of VS:  Vitals:  01/19/20 1400 01/19/20 1415 01/19/20 1430 01/19/20 1445 BP: 134/81 129/87 124/76 125/77 Pulse: 83 71 68 71 Resp: (!) 31 19 18 18  Temp:     TempSrc:     SpO2: 95% 96% 96% 96% Weight:     Height:     Baseline Neuro/developmental Status: somnolentLabs Collected: YesSpecial Needs of the Patient: noneAntibiotics: none given intraop or in PACUIV Access: Periph IV-single(adult) 01/19/20 0640 dorsum right arm over-the-needle catheter system 20 gauge Nurse (Active) SiteCare/Dressing Status/Securement dressing dry and intact 01/19/20 1145 Lumen 1 Patency/Care Patent;flushed w/o difficulty 01/19/20 1145 Site Signs asymptomatic with no redness, no swelling, no drainage 01/19/20 1145 Phlebitis 0-->no symptoms 01/19/20 1145 Infiltration/Extravasation Assessment 0-->no symptoms 01/19/20 1145 Patient Education Instructed to keep IV site dry;Instructed to call nurse if site is painful,red,swollen, burning;Instructed to call nurse if fluid leaking from site 01/19/20 1145 Daily Review of Necessity ** completed 01/19/20 1145   Periph IV-single(adult) 01/19/20 0744 metacarpal(dorsum of hand), left over-the-needle catheter system 18 gauge SRNA (Active) SiteCare/Dressing Status/Securement dressing dry and intact 01/19/20 1145 Lumen 1 Patency/Care Patent;flushed w/o difficulty 01/19/20 1145 Site Signs asymptomatic with no redness, no swelling, no drainage 01/19/20 1145 Phlebitis 0-->no symptoms 01/19/20 1145 Infiltration/Extravasation Assessment 0-->no symptoms 01/19/20 1145 Patient Education Instructed to keep IV site dry;Instructed to call nurse if site is painful,red,swollen, burning;Instructed to call nurse if fluid leaking from site 01/19/20 1145 Daily Review of Necessity ** completed 01/19/20 1145   Anesthesia Arterial Line  (Adult, Obstetrics) 01/19/20 0753 Right 20 gauge (Active) SiteCare/Dressing Status/Securement dressing dry and intact 01/19/20 1145 Phlebitis 0-->no symptoms 01/19/20 1145 Infiltration/Extravasation Assessment 0-->no symptoms 01/19/20 1145 Site Signs asymptomatic with no redness, no swelling, no drainage 01/19/20 1145 Waveform normal 01/19/20 1145 Pressure Catheter Interventions line leveled/zeroed 01/19/20 1145 Daily Review Of Necessity completed 01/19/20 1145 IV Fluids:   Number Scale Pain Rating 0: 0-->no pain  Wound 01/19/20 0729 Incision abdomen-Incision Closure : unable to assess, other (see comments)(3 sites)  Body Position: supineHead of Bed (HOB): HOB at 30 degrees   Time of Last Void or Time that Urinary Catheter was Removed Intra-Op: last bladder scan at: 1238, for 160 ml Additional Info: Patent attorney (name and phone number): Deliah Boston, RNPlan of Care Overview/ Patient Status    Pt arrived to PACU approx 1145; arrived sedated on non-rebreather and an oral airway in place; pt more arousable approx an hour after arrival however continues to be somnolent, arouses to voice and engaging in conversation for short periods of time then falls back asleep-spoke to Dr. Dierdre Searles plan to admit pt overnight for monitoring; pt denies pain at this time; HD stable on telemetry HR 70-80's SR, b/p stable, aline in place-plan to remove prior to transfer; currently on RA, no apnea or desaturation; bladder scanned upon arrival for 160cc urine; intermittent nausea present-zofran given w/good effect; abdominal dressings d/c/i w/ice; wife at bedside to visit pt for short period of time before going home Addendum: waiting for bed assignment; no changes to neuro exam; bladder scanned again for 361cc; small amt sang drainage to 2 out of the 3 abdominal dressings; called wife to update regarding transferring pt to V4N; plan to transfer to floor

## 2020-01-19 NOTE — Anesthesia Post-Procedure Evaluation
Anesthesia Post-op NotePatient: Aaron Buff GarciaProcedure(s):  Procedure(s) (LRB):Bilateral Robotic Inguinal Hernia Repair (Bilateral)Bilateral Robotic Inguinal Hernia Repair (Bilateral) Patient location: PACULast Vitals:  I have noted the vital signs as listed in the nursing notes.Mental status recovered: patient participates in evaluation: Patient drowsy but responds to commands.Vital signs reviewed: YesRespiratory function stable:YesAirway is patent: YesCardiovascular function and hydration status stable: YesPain control satisfactory: YesNausea and vomiting control satisfactory:Yes

## 2020-01-19 NOTE — Brief Op Note
[facility]        [id number] Patient DOB: 1971-03-25     Surgery Date: 3/31/2021Surgeon(s) and Role:   * Bobbie Stack., MD - PrimaryAssistant(s):Resident: Pincus Badder, MD; Dion Body, MDStaff:  Circulator: Laural Benes, RN; Dorann Ou, RNRelief Circulator: Lynnae January, RNScrub Person: Maryjane Hurter, MarcPre-Op Diagnosis: Non-recurrent bilateral inguinal hernia without obstruction or gangrene [K40.20] Procedure(s) and Anesthesia Type:   * Bilateral Robotic Inguinal Hernia Repair - PERIPHERAL NERVE BLOCK   * Bilateral Robotic Inguinal Hernia Repair - PERIPHERAL NERVE BLOCKOperative Findings (enter relevant operative findings; do not refer to an operative report that is not yet transcribed): robotic bilateral inguinal hernia repair with meshSigns of infection present at the time of surgery at the operative site: None Blood and Blood Products: none                 Drains:  noneImplants: Implant Name Type Inv. Item Serial No. Manufacturer Lot No. LRB No. Used Action MESH 3DMAX LIGHT XLARGE RIGHT - [id number] Implant MESH 3DMAX LIGHT XLARGE RIGHT  CR BARD [id number] Right 1 Implanted MESH 3DMAX LIGHT XLG L 4.8X6.7 - [id number] Implant MESH 3DMAX LIGHT XLG L 4.8X6.7  CR BARD [id number] Left 1 Implanted  Specimens: * No specimens in log * Clinical Staging: Class IEBL: Minimal       Post Operative Diagnosis: * No post-op diagnosis entered Pincus Badder, MD3/31/20211:47 PM

## 2020-01-19 NOTE — Anesthesia Pre-Procedure Evaluation
This is a 49 y.o. male scheduled for Bilateral Robotic Inguinal Hernia Repair (Bilateral )Bilateral Robotic Inguinal Hernia Repair (Bilateral ).Review of Systems/ Medical HistoryPatient summary, nursing notes, EKG/Cardiac Studies , Labs, pre-procedure vitals, height, weight and NPO status reviewed.No previous anesthesia concernsAnesthesia Evaluation:   No history of anesthetic complications  Estimated body mass index is 27.71 kg/m? as calculated from the following:  Height as of this encounter: 6' 1 (1.854 m).  Weight as of this encounter: 95.3 kg. Cardiovascular: Negative   -Exercise tolerance: >4 METS -Vascular Disease:  Negative   Respiratory:  Negative.HEENT: Negative.Neuromuscular: NegativeSkeletal/Skin:  NegativeGastrointestinal/Genitourinary:  Negative -Nutritional Disorders: Patient has has increased body weightHematological/Lymphatic: NegativeEndocrine/Metabolic:  Negative.Behavioral/Psychiatric & Syndromes: NegativePhysical ExamCardiovascular:  normal exam  Rhythm: regularHeart Sounds: S1 present and S2 present.Pulmonary:  normal exam  Patient's breath sounds clear to auscultationAirway:  Mallampati: ITM distance: >3 FBNeck ROM: fullDental:  normal exam  Anesthesia PlanASA 2 The primary anesthesia plan is  general ETT. Perioperative Code Status confirmed: It is my understanding that the patient is currently designated as 'Full Code' and will remain so throughout the perioperative period.Anesthesia informed consent obtained. Consent obtained from: patientUse of blood products: consented  The post operative pain plan is IV analgesics and perineural.Plan discussed with SRNA.Anesthesiologist's Pre Op NoteI personally evaluated and examined the patient prior to the intra-operative phase of care.  Risks and Benefits are discussed. . Risks Including but not limited to PONV, Awareness/Recall risks, transfusion related issues if indicated, Dental/Soft tissue in oral/Pharyngeal cavity damage, eye issues and position related nerve injury etc.

## 2020-01-19 NOTE — Other
Operative Diagnosis:Pre-op:   Non-recurrent bilateral inguinal hernia without obstruction or gangrene [K40.20] Patient Coded Diagnosis   Pre-op diagnosis: Non-recurrent bilateral inguinal hernia without obstruction or gangrene  Post-op diagnosis: Non-recurrent bilateral inguinal hernia without obstruction or gangrene  Patient Diagnosis   Pre-op diagnosis: Non-recurrent bilateral inguinal hernia without obstruction or gangrene [K40.20]  Post-op diagnosis:     Post-op diagnosis:   * Non-recurrent bilateral inguinal hernia without obstruction or gangrene [K40.20]Operative Procedure(s) :Procedure(s) (LRB):Bilateral Robotic Inguinal Hernia Repair (Bilateral)Bilateral Robotic Inguinal Hernia Repair (Bilateral)Post-op Procedure & Diagnosis ConfirmationPost-op Diagnosis: Post-op Diagnosis confirmed (no changes)Post-op Procedure: Post-op Procedure confirmed (no changes)

## 2020-01-19 NOTE — Anesthesia Post-Procedure Evaluation
Anesthesia Post-op NotePatient: Aaron Buff GarciaProcedure(s):  Procedure(s) (LRB):Bilateral Robotic Inguinal Hernia Repair (Bilateral)Bilateral Robotic Inguinal Hernia Repair (Bilateral) Patient location: PACULast Vitals:  I have noted the vital signs as listed in the nursing notes.Mental status recovered: patient participates in evaluation: YesVital signs reviewed: YesRespiratory function stable:YesAirway is patent: YesCardiovascular function and hydration status stable: YesPain control satisfactory: YesNausea and vomiting control satisfactory:Yes

## 2020-01-19 NOTE — Utilization Review (ED)
UM Status: Meets Outpatient Status notes state 02/26 called Medicaid spoke to Alexa @ 09:21am, no auth required for cpt code 16109, ref # [id number]

## 2020-01-19 NOTE — Anesthesia Procedure Notes
Anesthesia Block:  quadratus lumborum and rectus sheathPain Diagnosis/Location: abdomenThis block procedure is being performed for post-operative pain management at the request of:      Requesting Physician: PassarelliPre-procedure Checklistpatient identified; site marked; surgical consent reviewd; pre-op evaluation performed; Universal Protocol/timeout performed; IV checked; monitors and equipment checked; informed consent obtained and options, plan, risks & benefits discussed with patientUniversal ProtocolTiming: the time out is initiated after the patient is positioned and prior to the beginning of the procedure: YesName of patient and medical record number or DOB (if MRN is unavailable) stated and checked with ID band or        previously confirmed MEDICAL RECORD NUMBERYesProceduralist states or confirms the procedure to be performed: YesThe procedural consent is used to verify the procedure to be performed: YesSite of procedure(s) (with laterally or level) is topically marked per policy and visible after draping: YesRadiographic imaging is present or a laterality side/site band is present on patient and is accessible: N/AMedications addressed: YesImaging addressed: N/AImplants addressed: N/ASpecial equipment or other needs addressed: N/AProceduralist discusses particular challenges or special considerations: N/APatient's pre-procedure mental status:  Sedate but cooperativePrep: chloraprepNeedle A:  quadratus lumborumLaterality: bilateralInjection technique: single-shotNeedle: Short-bevel      Gauge: 20 G      Length: 4 inTechnique: Ultrasound guided     - Imaging guidance was used to decrease the risk of complications. A permanent image has been stored in the patient's medical record. The needle tip was noted to be adjacent to the nerve/plexus identified. Appropriate spread of the medication was noted in real time. There was no ultrasound evidence of intravascular and/or intraneural injection. Event(s): blood not aspirated, injection not painful, no injection resistance, no cerebrospinal fluid, no paresthesia and no other eventPerformed By:      - Anesthesiologist: Aaron Irwin, with resident      - Other anesthesia staff: Aaron Irwin B: rectus sheathInjection technique: single-shotNeedle: Short-bevel      Gauge: 20 G      Length: 4 inTechnique: Ultrasound guided     - Imaging guidance was used to decrease the risk of complications. A permanent image has been stored in the patient's medical record. The needle tip was noted to be adjacent to the nerve/plexus identified. Appropriate spread of the medication was noted in real time. There was no ultrasound evidence of intravascular and/or intraneural injection. Event(s): no blood aspirated, no injection painful, no injection resistance, no paresthesia and no other eventPerformed by: , with resident Outcome:  successful blockAttempts:  block completePatient tolerated block procedure well?:  yesAdditional Notes:Regional Anesthesiology/Block Service Consult:Procedure name: Procedure(s) (LRB):Bilateral Robotic Inguinal Hernia Repair (Bilateral)Bilateral Robotic Inguinal Hernia Repair (Bilateral)HPI: 49 y.o. male presenting for aforementioned procedure. Regional team consulted for PNB for post op analgesia.ZOX:WRUE Medical History:No date: ScarPrior to Admission Medications:(Not in a hospital admission)Allergies:No Known AllergiesPhysical exam:Gen: no acute distressCardiac: regular rate and rhythmPulmonary: no respiratory distress, breathing normallyAbdominal: non-distendedMusculoskeletal: Normal range of motion Neuro: intact sensation , no motor deficits Airway ExamI evaluated the patient's airway prior to procedure. Mallampati II (hard and soft palate, upper portion of tonsils anduvula visible)Vital Signs:WNLPatient consented to documented procedure. Risks (including but not limited to bleeding, infection, damage to nearby structures, nerve injury, persistent weakness, inadequate analgesia and local anesthetic toxicity), benefits and alternatives of block discussed with patient.  All questions answered to patient's satisfaction.Signed: Myrtha Mantis, MD For questions please call: [facility] Block Service  - [phone]/31/20216:58 AM

## 2020-01-19 NOTE — Plan of Care
Admission Note Nursing Aaron Irwin is a 49 y.o. male admitted with a chief complaint of Bilateral. Patient arrived from  Bilateral robotic inguinal hernia repairPatient is  Orientation: oriented x 4Level of Consciousness: somnolent Vitals:  01/19/20 1515 01/19/20 1600 01/19/20 1700 01/19/20 1800 BP: 120/82 121/79 123/81 (!) 150/98 Pulse: 75 77 76 89 Resp: (!) 21 17 17 18  Temp:    97.6 ?F (36.4 ?C) TempSrc:    Axillary SpO2: 95% 100% 100% 97% Weight:     Height:     Oxygen therapy Oxygen TherapySpO2: 97 %Device (Oxygen Therapy): room airO2 Flow (L/min): 8$Oxygen On/Off : Off (stops the charge episode)I have reviewed the patient's current medication orders.Current Facility-Administered Medications Medication Dose Route Frequency Last Rate  ondansetron (PF)        ondansetron (PF)        ondansetron (PF)  4 mg IV Push Q6H PRN    oxyCODONE  10 mg Oral Q4H PRN    oxyCODONE  5 mg Oral Q4H PRN   .Kainon is A&O x4. On report, pt was lethargic, sleeping in between care. However, once up to unit, pt is alert and oriented, able to follow commands. VSS. On RA. Denies chest pain/SOB. IS encouraged 10x every hour while awake. Bedrest. Diet: Reg. Nausea/ vomiting at bedside- IV zofran given with positive effect.  LBM: 01/18/2020. DTV. Urinal at bedside. Skin: Three gauze & teg on abdomen. The center and right incisions have moderate serosanguinous drainage. +CMS, +dorsi/plantarflexion. Denies Numbness/tingling. Denies calf tenderness. DVT PPX: SCD's in place. See flowsheets for details. Safety maintained.

## 2020-01-19 NOTE — Other
SURGERY INTERVAL H&P NOTE Subsequent to admission for surgery or invasive procedure, I have reassessed the patient by examination and review of relevant data pertaining to the planned procedure. I have verified the planned procedure and there are no relevant changes since the H&P.BackgroundMarc Irwin is a 49 y.o. male with bilateral inguinal hernia, scheduled for robotic-assisted bilateral IHR. PMHx:  NonePSHx:   Knee arthrocentesisMeds: atorvastatin, ibuprofenAll: NKDAThere were no vitals filed for this visit.Physical exam:Gen: Awake, alert, NADNeuro: AOx3Lungs: CTAB, good respiratory effort, on RAHeart: RRRAbd: soft, non-tender, non-distended. Ext: warm, well perfused x4 A/P: Plan: Booked and consented for robotic-assisted bilateral IHR with Dr. Mariana Single.  COVID neg.Signed: Chestine Spore, PGY1Mobile HB: [phone]/31/216:12 AM-------------------History of Present Illness:  ?Aaron Irwin is a 49 y.o. male who presents with discomfort and lumps in both groins.  He has noted the discomfort in the groins over the past six weeks.  He is eating well and having normal BMs.  His father is currently being investigated for hernias.  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    Past Medical History: Diagnosis Date ? Scar ? ??       Past Surgical History: Procedure Laterality Date ? KNEE ARTHROCENTESIS ? ? ?? No Known Allergies? Social History ?     Socioeconomic History ? Marital status: Married ? ? Spouse name: Not on file ? Number of children: Not on file ? Years of education: Not on file ? Highest education level: Not on file Occupational History ? Not on file Social Needs ? Financial resource strain: Not on file ? Food insecurity ? ? Worry: Not on file ? ? Inability: Not on file ? Transportation needs ? ? Medical: Not on file ? ? Non-medical: Not on file Tobacco Use ? Smoking status: Never Smoker Substance and Sexual Activity ? Alcohol use: Yes ? ? Comment: social ? Drug use: No ? Sexual activity: Not on file Lifestyle ? Physical activity ? ? Days per week: Not on file ? ? Minutes per session: Not on file ? Stress: Not on file Relationships ? Social connections ? ? Talks on phone: Not on file ? ? Gets together: Not on file ? ? Attends religious service: Not on file ? ? Active member of club or organization: Not on file ? ? Attends meetings of clubs or organizations: Not on file ? ? Relationship status: Not on file ? Intimate partner violence ? ? Fear of current or ex partner: Not on file ? ? Emotionally abused: Not on file ? ? Physically abused: Not on file ? ? Forced sexual activity: Not on file Other Topics Concern ? Not on file Social History Narrative ? Not on file ?? ?     Current Outpatient Medications on File Prior to Visit Medication Sig Dispense Refill ? atorvastatin (LIPITOR) 10 mg tablet Take 10 mg by mouth daily. ? ? ? ibuprofen (ADVIL,MOTRIN) 800 MG tablet Take 1 tablet (800 mg total) by mouth every 8 (eight) hours as needed.. 20 tablet 0 ?No current facility-administered medications on file prior to visit.  ?? ??Objective:?Visit Vitals: There were no vitals taken for this visit.?ROS: Complete ten system review of systems performed and documented with pertinent findings documented in the interval history.?On Exam: Examination of the head and neck is unremarkableLungs clear to auscultation Cardiac regular rate and rythym, no murmurs Abdomen soft and not tender.  He has lumps in both groins that are reducibleExtremities warm without edemaNeurologically grossly intact?Impression:Bilateral inguinal hernias.  I will fix  these robotically.  The patient understands the indications for the surgery.  They also understand the risks that include but are not limited to recurrence, infection, hemorrhage, conversion to open, intestinal perforation and they consent.????Ashok Pall, MDElectronically Signed by Bobbie Stack, MD, December 17, 2019?  Progress NotesExpand AllCollapse All  History of Present Illness:  ?Aaron Irwin is a 49 y.o. male who presents with discomfort and lumps in both groins.  He has noted the discomfort in the groins over the past six weeks.  He is eating well and having normal BMs.  His father is currently being investigated for hernias.  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 Scar ?  ?? Past Surgical History       Past Surgical History: Procedure Laterality Date ? KNEE ARTHROCENTESIS ? ?  ?? No Known Allergies? Social History  ?     Socioeconomic History ? Marital status: Married ? ? Spouse name: Not on file ? Number of children: Not on file ? Years of education: Not on file ? Highest education level: Not on file Occupational History ? Not on file Social Needs ? Financial resource strain: Not on file ? Food insecurity ? ? Worry: Not on file ? ? Inability: Not on file ? Transportation needs ? ? Medical: Not on file ? ? Non-medical: Not on file Tobacco Use ? Smoking status: Never Smoker Substance and Sexual Activity ? Alcohol use: Yes ? ? Comment: social ? Drug use: No ? Sexual activity: Not on file Lifestyle ? Physical activity ? ? Days per week: Not on file ? ? Minutes per session: Not on file ? Stress: Not on file Relationships ? Social connections ? ? Talks on phone: Not on file ? ? Gets together: Not on file ? ? Attends religious service: Not on file ? ? Active member of club or organization: Not on file ? ? Attends meetings of clubs or organizations: Not on file ? ? Relationship status: Not on file ? Intimate partner violence ? ? Fear of current or ex partner: Not on file ? ? Emotionally abused: Not on file ? ? Physically abused: Not on file ? ? Forced sexual activity: Not on file Other Topics Concern ? Not on file Social History Narrative ? Not on file  ?? ?     Current Outpatient Medications on File Prior to Visit Medication Sig Dispense Refill ? atorvastatin (LIPITOR) 10 mg tablet Take 10 mg by mouth daily. ? ? ? ibuprofen (ADVIL,MOTRIN) 800 MG tablet Take 1 tablet (800 mg total) by mouth every 8 (eight) hours as needed.. 20 tablet 0 ?No current facility-administered medications on file prior to visit.  ?? ??Objective:?Visit Vitals: There were no vitals taken for this visit.?ROS: Complete ten system review of systems performed and documented with pertinent findings documented in the interval history.?On Exam: Examination of the head and neck is unremarkableLungs clear to auscultation Cardiac regular rate and rythym, no murmurs Abdomen soft and not tender.  He has lumps in both groins that are reducibleExtremities warm without edemaNeurologically grossly intact?Impression:Bilateral inguinal hernias.  I will fix these robotically.  The patient understands the indications for the surgery.  They also understand the risks that include but are not limited to recurrence, infection, hemorrhage, conversion to open, intestinal perforation and they consent.

## 2020-01-19 NOTE — Other
Post Anesthesia Transfer of Care NotePatient: Aaron Irwin) Performed: Procedure(s) (LRB):Bilateral Robotic Inguinal Hernia Repair (Bilateral)Bilateral Robotic Inguinal Hernia Repair (Bilateral) Patient location: PACU Last Vitals: Vitals Value Taken Time BP 143/83 01/19/20 1200 Temp 36.3 ?C 01/19/20 1155 Pulse 68 01/19/20 1200 Resp 18 01/19/20 1200 SpO2 100 % 01/19/20 1200 Vitals shown include unvalidated device data.Level of consciousness: sedated, alert  and orientedTransport Vital Signs:  Stable since the last set of recorded intra-operative vital signsComplications: noneIntra-operative Intake & Output and Antibiotics as per Anesthesia record and discussed with the RN.

## 2020-01-20 ENCOUNTER — Encounter: Admit: 2020-01-20 | Payer: PRIVATE HEALTH INSURANCE | Attending: Anesthesiology | Primary: Internal Medicine

## 2020-01-20 ENCOUNTER — Ambulatory Visit: Admit: 2020-01-20 | Payer: PRIVATE HEALTH INSURANCE | Attending: Pediatric Dentistry | Primary: Internal Medicine

## 2020-01-20 DIAGNOSIS — L905 Scar conditions and fibrosis of skin: Secondary | ICD-10-CM

## 2020-01-20 MED ORDER — OXYCODONE IMMEDIATE RELEASE 5 MG TABLET
5 mg | ORAL_TABLET | ORAL | 1 refills | Status: AC | PRN
Start: 2020-01-20 — End: 2020-02-17

## 2020-01-20 MED ORDER — SIMETHICONE 80 MG CHEWABLE TABLET
80 mg | Freq: Four times a day (QID) | ORAL | Status: DC | PRN
Start: 2020-01-20 — End: 2020-01-21
  Administered 2020-01-20 – 2020-01-21 (×2): 80 mg via ORAL

## 2020-01-20 MED ORDER — TAMSULOSIN 0.4 MG CAPSULE
0.4 mg | ORAL_CAPSULE | Freq: Every evening | ORAL | 1 refills | Status: AC
Start: 2020-01-20 — End: 2020-02-17

## 2020-01-20 MED ORDER — SENNOSIDES 8.6 MG-DOCUSATE SODIUM 50 MG TABLET
8.6-50 mg | Freq: Every evening | ORAL | Status: DC
Start: 2020-01-20 — End: 2020-02-17

## 2020-01-20 MED ORDER — ACETAMINOPHEN 325 MG TABLET
325 mg | ORAL_TABLET | Freq: Four times a day (QID) | ORAL | 12 refills | Status: AC | PRN
Start: 2020-01-20 — End: ?

## 2020-01-20 NOTE — Plan of Care
Plan of Care Overview/ Patient Status    Patient a/ox4. Independent in room. Difficulty voiding overnight - straight cath x 2. Patient seen by MD in morning and foley catheter ordered and placed by this RN. Foley placed at approximately 0930. By 1030, patient reported intense pain in and around foley insertion site (tip of penis to bladder). Foley catheter was draining clear yellow urine at this time. Patient got on bed on all 4s to alleviate the pain. This RN adjusted the foley (which was still working) but did not alleviate the patients pain. Patient HR increased to 120-130s. PA Raiola notified of event. Foley removed bvy this RN and PA informed. Patient then proceeded to bathroom where he states large amounts of gas came out. He felt as though the pain was mostly gas but did not want the catheter replaced. PA requested simethicone for gas and q6h bladder scans / void trials. HR returned to NSR 80s-90s. At 1330 , patient has voided 450cc and bladder scanned for 200cc. PA informed. This strategy will be continued and if no issues arise, patient will be discharged until tomorrow morning. At 1600, patient voided 600 cc with a bladder scan of 62. PA informed. Patient is on reg diet. Denies NVD. LBM 01-18-20. 3 gauze/tegaderm dsg to abdomen in place w/ scant drainage. Call bell within reach. Safety maintained. Lorriane Shire RN

## 2020-01-20 NOTE — Utilization Review (ED)
The pt is in OP status POD #1. Sp hernia repair complicated by urinary retention requiring st cathing. Allowed Regular dietAddendum:  dc planned for tomorrow. Voiding pattern being monitored.

## 2020-01-20 NOTE — Other
[place] HOSPITALOPERATIVE REPORTCONFIDENTIAL - DO NOT COPY WITHOUT APPROPRIATE AUTHORIZATIONName:  Aaron Irwin, Aaron Irwin  Service:  [facility] PERIOPUnit No:  [id number] CSN:  [id number] Service Area:  SurDate of Birth:  05-May-1972Date of Adm:  03/31/2021Dictated:  01/19/2020 10:32:07          Dictated by:  Ashok Pall, M.D.Transcribed:  01/19/2020 11:13:56       [id number] DATE OF PROCEDURE/SURGERY:  03/31/2021OPERATION:A bilateral robotic inguinal hernia repair.OPERATOR:Khrista Braun, M.D.ASSISTANT:ANESTHESIOLOGIST:ANESTHESIA:General endotracheal.PREOP DIAGNOSIS:Bilateral inguinal hernias.POSTOP DIAGNOSIS:Bilateral inguinal hernias.ESTIMATED BLOOD LOSS:Minimal.SPECIMENS REMOVED:N/ARESIDENT SURGEON:  Pincus Badder, M.D.COMPLICATIONS:  None.COUNTS:  Correct.INDICATION FOR PROCEDURE:  The patient is a 49 year old gentleman who has had lumps in both groins.  He presents for repair.PROCEDURE:  The patient is brought in the operating room, placed on the table in the supine position.  The abdomen is prepped and draped in normal sterile fashion.  We did not use local anesthetic.  After we made an 8 mm incision in the left upper quadrant, incision made with a knife down to the fascia.  We placed a Veress needle without difficulty and insufflated pneumoperitoneum.  We placed an 8 mm robotic port.  We inserted the camera.  We reviewed for any signs of any damage on entrance, and there were none.  Under direct vision, we placed an 8 mm port in the right upper quadrant and an 8 mm port in the midline midway between the xiphoid and the umbilicus.  We docked the robot to the middle arm.  We inserted the camera and targeted.  We then docked the remaining 2 arms.  We placed a forced bipolar in the left upper quadrant and a monopolar scissors in the right upper quadrant.  We viewed both groins.  On the left side, there was sigmoid colon that was stuck to the peritoneum.  On the right side, there was an obvious indirect hernia, as the defect was lateral to the inferior epigastric vessels.  We began on the left side.  We incised at the medial umbilical ligament.  We carried an incision all the way to the level of the anterior-superior iliac spine.  We then dissected out the flap, leaving the sigmoid colon and fat attached to the peritoneal flap.  We were careful not to injure what was on the opposite side of the flap.  I swept the flap out and made a very broad flap.  I then went medially and identified symphysis pubis and Cooper ligament.  I went lateral to medial at this point in brushing material off the peritoneum until I was brushing off the spermatic cord, the vas, and the vessels.  I then developed an indirect inguinal hernia sac emanating into the internal inguinal ring.  I dissected this out in its entirety.  I then completed the peritoneal dissection medially so that the mesh would lay flat.  There was a small lipoma of the cord that we completely dissected out.  At this point, this dissection was complete.  The direct space was intact, as was the femoral space.  We then turned our attention to the right side.  We made an incision at the medial umbilical ligament.  We carried this to the level of the anterior-superior iliac spine.  We dissected out a wide peritoneal flap.  We then came down onto the indirect inguinal hernia sac and brought this out of the internal inguinal ring, sweeping the vas and the vessels off it completely without handling them.  We continued medially to identify symphysis pubis and Cooper ligament.  There was no  direct hernia.  There was no femoral hernia.  The peritoneum was completely dissected back.  We then took an extra-large right lightweight 3DMax mesh from [employer] and placed it into the space.  It greatly overlapped our defect.  It laid flat, and the peritoneum was not under the mesh.  We closed the peritoneal flap, including the hernia sac with a running suture of 3-0 Vicryl 6-inch V-Loc.  We then took a left extra-large 3DMax mesh from [employer], lightweight, and placed it into the left space.  It lay flat.  It overlapped our mesh from the opposite side, and it greatly overlapped all 3 spaces.  We did not tack the mesh.  We closed the flap using a running 6-inch 3-0 Vicryl V-Loc suture.  At this point, we removed all of our instruments.  We had all needles removed and accounted for.  We then desufflated pneumoperitoneum.  We removed our ports.  We closed the skin incisions using running subcuticular sutures of 4-0 Monocryl, Mastisol, Steri-Strips, dressings.  Patient tolerated the procedure well.  There were no immediate complications.  Patient is to be extubated in the operating room and will be transported to the recovery room in stable condition later on today to home. I, Dr. Ashok Pall, was present throughout the case.

## 2020-01-20 NOTE — Discharge Instructions
Dressings:?	Please keep the tegaderm (plastic dressing) in place for 48 hours. ?	You may shower, beginning tomorrow, with the tegaderm in place and then pat the dressing dry after. ?	Do not submerge your wound(s) in water (no baths or swimming).Activity:?	No heavy lifting, pushing, or pulling (nothing heavier than a gallon of milk)?	Do not drink alcohol or drive in the first 24 hours after surgery.?	Do not drink alcohol or drive while taking narcotic pain medications.Medications:?	You have been prescribed a pain medication that is a narcotic.?	Absolutely no drinking alcohol or driving while taking this medication.?	If your pain is not too severe, you can substitute with mild, over-the-counter, pain medications such as Tylenol or Motrin/Advil. ?	If your prescription medication already contains Tylenol (acetaminophen) you SHOULD NOT TAKE THE TWO TOGETHER.?	If you are using Tylenol regularly to treat your pain, be sure to not consume more than 4,000 mg in 24 hours time. ?	If taking narcotics regularly for pain post-operatively, you should take a stool softener or laxative in addition. ?	The rest of your home medications can resume as you usually take them unless you have been instructed otherwise.Diet:?	You can resume a regular diet as tolerated although it is best if your first meal after surgery is light as anesthesia can affect your food tolerance.Special Considerations:?	Bruising of the penis and scrotum is a normal occurrence after inguinal hernia surgery. If you feel that it is excessive, please do not hesitate to call the office.?	If you have any difficulty voiding (urinating) please call the office immediately. Follow up:?	Please call Dr. Mariana Single for an appointment in the office in 2 weeks.?	If you have any questions/concerns, please call the office sooner.?	Call the office if you have any fevers, chills, or purulent drainage from the surgical site.

## 2020-01-20 NOTE — Progress Notes
Regional Anesthesiology/Block Service Progress NoteMarc Irwin is a 49 y.o. M who is POD 1 s/p bilateral quadratus lumborum and rectus sheath nerve block(s) for post-operative pain management. Today, patient reports pain is well controlled. He is ambulating without difficulty. The patient reports no residual numbness or tingling. He states that he is having some urinary retention which is improving. Disclosed to the patient at bedside the events in detail that occurred yesterday. It was disclosed to the patient that he likely experienced local anesthetic toxicity (LAST) despite following the appropriate protocols and practices to prevent this. It was explained that there was no blood aspirated at any point nor were there any blood vessels present on the ultrasound visualization. He understood that we recognized immediately after completing the peripheral nerve block that he had a depressed level of consciousness that was out of proportion to the sedation given and the appropriate medication for LAST was administered immediately. It was also disclosed that he was taken emergently to the OR and was intubated immediately for airway protection. The patient was apologized to for the events that took place and expressed appropriate understanding. Patient states that he had no recollection of the events, only that he took a while to come out of general anesthesia.   Pt and wife were made aware of the entire event, and that he did not have an allergic reaction and that he can have local anesthetics in the future in the format of nerve blocks or local infiltration such as dental work. Both pt and wife acknowledged understanding.  Physical Exam:General: NADAbdomen: sensation intact, nondistended, nontender, dressing c/d/iAssessment/Plan: Patient reports pain is well controlled. The pain is being managed by primary service. Ongoing pain management will be by the primary service. Regional anesthesiology service will sign off at this time. Please call with questions.Thank you for involving our team in the care of this patient.Servando Salina, MDFor questions please call: [facility] Block Service  - [phone] or [facility]

## 2020-01-20 NOTE — Progress Notes
[place] [facility]	Surgery Progress NoteAttending Provider: Bobbie Stack., MD Admit Date: 3/31/2021Hospital Day: 21 Day Post-Op Procedure(s) (LRB):Bilateral Robotic Inguinal Hernia Repair (Bilateral)Bilateral Robotic Inguinal Hernia Repair (Bilateral)Admission History Quindarrius Joplin is a 49 y.o. male who is now s/p bilateral inguinal hernia repair (robotic-assisted) on 3/31, who was admitted due to increased somnolence pre-operatively after a paravertebral block.Subjective/Interim History: ?	With urinary retention s/p 2 straight caths for 500 cc x 2?	Pain well controlled?	Diet Regular, no N/V?	Ambulatory and voiding without difficulty?	No other concerns at the momentObjective: Temp:  [97 ?F (36.1 ?C)-98.3 ?F (36.8 ?C)] 98.1 ?F (36.7 ?C)Pulse:  [54-100] 88Resp:  [14-31] 18BP: (119-159)/(67-98) 159/70SpO2:  [94 %-100 %] 97 %Device (Oxygen Therapy): room airO2 Flow (L/min):  [8] 8I/O last 3 completed shifts:In: 1600 [I.V.:1600]Out: 100 [Urine:100]Physical Exam: GEN: NADNEURO: Alert, resting in bed comfortably, responding appropriatelyHEENT:  NCATPULM: No respiratory distress, breathing comfortably on RACV: Regular rateABD: Soft, nondistended, nontender, 3 port site incisions c/d/iEXT: Moves all extremities equally, warm and well perfusedLabs CBCLab Results Component Value Date  WBC 7.6 01/19/2020  HGB 14.7 01/19/2020  HCT 40.8 01/19/2020  PLT 283 01/19/2020 LYTESLab Results Component Value Date  NA 134 01/19/2020  K  01/19/2020    Comment:    Sample Hemolyzed.   CL 100 01/19/2020  CO2 26 01/19/2020  CREATININE 0.90 01/19/2020  BUN 8 01/19/2020  GLU 178 (H) 01/19/2020  CALCIUM 8.3 (L) 01/19/2020  MG 1.9 01/19/2020  PHOS 3.1 01/19/2020 LFTsLab Results Component Value Date  BILITOT 0.5 01/19/2020  BILITOT 0.4 01/19/2020  BILIDIR 01/19/2020    Comment:    Sample Hemolyzed.   AST  01/19/2020    Comment:    Sample Hemolyzed.   AST  01/19/2020    Comment:    Sample hemolyzed.  ALT  01/19/2020    Comment:    Sample hemolyzedCalcium dobesilate can cause artificially low ALT results at therapeutic concentrations  ALT  01/19/2020    Comment:    Sample hemolyzed.Calcium dobesilate can cause artificially low ALT results at therapeutic concentrations  ALKPHOS 53 01/19/2020  ALKPHOS 53 01/19/2020 PT/INR/PTTNo results found for: INR, PTTImaging over last 24 hours: No results found.Assessment / Plan Wyat Infinger is a 49 y.o. male s/p b/l robotic IHR (3/31). Course complicated by urinary retention requiring 2 straight caths during the night. Stable, doing well on the floor. - Pain control: APAP, oxy 5/10 prn.- Diet: Diet Regular- Senna+, miralax, flomax- OOB/IS/pulmonary toilet- Resume appropriate home meds- Monitor I/Os- Dispo planning: Home pending urinary issuesSigned:Fanta Wimberley G Haidyn Kilburg, MD4/1/20215:53 AMAttending Addendum:

## 2020-01-20 NOTE — Plan of Care
Plan of Care Overview/ Patient Status    Patient AOx4 resting in bed. Denies any SOB, chest pain, afebrile. Main complain feeling some pressure and discomfort when trying to void also described as feeling constipated. Pt received miralax, senna and flomax. Pt due to void initially bladder scanned for 500+ with small urine output at a time not greater than 100cc Resident Su notified, ambulation and all alternatives utilized to aid patient void. Scath once performed with output no SS of discomfort after as per resident monitor  bladder in the AM will update note .RA lungs are clear in all fields. IS encouraged 10 times an hour while awake.BP 119/67  - Pulse 80  - Temp 98.3 ?F (36.8 ?C) (Oral)  - Resp 18  - Ht 6' 1 (1.854 m)  - Wt 92.3 kg  - SpO2 98%  - BMI 26.83 kg/m? . +CMS, no numbness or tingling. No drains. Skin intact other than incision sites gauze and transparent film with dry drainage. No braces. Voiding. +BS. LBM31 per pt statement. No IVF. IV patency verified. Ambulating with assistance hand held. Refuse needing something for pain.Pt educated on safety and precautions. Safety maintained call bell and belongings within reach. 5:33 AM . Pt attempted to void with elevated PVR. 496 on bladder scan , straight cath as per resident order total output 500cc. Second cath performed with caude tip with less pt discomfort, as per pt statement never had prostate checked.  Ioannis Schuh Darrelyn Hillock, RN

## 2020-01-21 DIAGNOSIS — Z79899 Other long term (current) drug therapy: Secondary | ICD-10-CM

## 2020-01-21 LAB — URINALYSIS-MACROSCOPIC W/REFLEX MICROSCOPIC
BKR BILIRUBIN, UA: NEGATIVE
BKR BLOOD, UA: NEGATIVE
BKR GLUCOSE, UA: NEGATIVE
BKR KETONES, UA: NEGATIVE
BKR LEUKOCYTE ESTERASE, UA: NEGATIVE
BKR NITRITE, UA: NEGATIVE
BKR PH, UA: 7.5 (ref 5.5–7.5)
BKR SPECIFIC GRAVITY, UA: 1.018 (ref 1.005–1.030)
BKR UROBILINOGEN, UA: <2 EU/dL (ref <=2.0)

## 2020-01-21 NOTE — Plan of Care
Plan of Care Overview/ Patient Status    Pt is A+O x4. POD 2 from inguinal hernia repair. States that pain is a 0/10. Denies chest pain and shortness of breath. Lung sounds clear and equal bilaterally on room air. Normoactive bowel sounds. LBM on 3/31. Tolerating a regular diet. Denies N/V. Voiding spontaneously in urinal at bedside. Dried serosanguineous drainage present on abdominal incision dressing. Denies numbness and tingling in hands and feet. SCD in place bilaterally for DVT prophylaxis. Call bell within reach and safety maintained.

## 2020-01-21 NOTE — Plan of Care
Problem: Adult Inpatient Plan of CareGoal: Readiness for Transition of CareOutcome: Outcome(s) achieved Plan of Care Overview/ Patient Status    CM Assessment and Discharge Planning  Aaron Irwin is a 49 y.o. male who is now s/p bilateral inguinal hernia repair (robotic-assisted) on 3/31, who was admitted due to increased somnolence pre-operatively after a paravertebral block.  Most Recent Value Case Management Assessment Patient Requires Care Coordination Intervention Due To  none identified on admission Arrived from prior to admission  home Services Prior to Admission  none ADL Assistance  None What equipment do you currently use at home?  none Documented Insurance Accurate  Yes Any financial concerns related to anticipated discharge needs  No Patient's home address verified  Yes Living Environment  Lives With  Spouse Current Living Arrangements  home/apartment/condo Home Accessibility  no concerns Transportation Available  family or friend will provide Home Safety Feels Safe Living In Home  deferred Source of Clinical History Patient's clinical history has been reviewed and source of Information is:  Medical record Completed Assessment Role:  Discharge Planner CM/SW Attestation: Choose which ONE is appropriate for you I have reviewed the medical record and completed the above evaluation with the following recommendations.  Yes Discharge Planning Coordination Recommendations Discharge Planning Coordination Recommendations  Home with MD follow-up/No needs identified RN reviewed plan of care/ continuum of care need's with   Transitional care rounds Discharge Planning Concerns to be Addressed  no discharge needs identified Barriers to Discharge  no barriers identified Referral(s) placed for:  None Patient is considered homebound due to: (he/she requires considerable and taxing effort to leave their residence for medical reasons or religious services OR infrequently OR of short duration for other reasons)  n/a Equipment Needed After Discharge  none Mode of Transportation   Private car  (add comment for special considerations) Patient to be accompanied by  spouse CM D/C Readiness PASRR completed and approved  N/A Authorization number obtained, if required  N/A Is there a 3 day INPATIENT Qualifying stay for Medicare Patients?  N/A Medicare IM- signed, dated, timed and scanned, if required  N/A DME Authorized/Delivered  N/A No needs identified/ follow up with PCP/MD  Yes Post acute care services secured W10 complete  N/A Pri Completed and Accepted   N/A Finalized Plan Expected Discharge Date  01/20/20 Post acute care services secured W10 complete  N/A  Pt has been cleared for discharge no post acute needs will follow up with attending as an  Out pt.  Pt has transportation to home by [employer] RN [employer] [phone]

## 2020-01-21 NOTE — Plan of Care
Plan of Care Overview/ Patient Status    Patient AOx4 resting in bed. Denies any SOB, chest pain, afebrile. Main complain bladder discomfort after void MD Sick notified and at bedside to assess patient no further interventions at this time.RA lungs are clear in all fields. IS encouraged 10 times an hour while awake.BP 124/75  - Pulse 81  - Temp 98.2 ?F (36.8 ?C) (Oral)  - Resp 18  - Ht 6' 1 (1.854 m)  - Wt 92.3 kg  - SpO2 97%  - BMI 26.83 kg/m? Marland Kitchen +CMS Skin intact dried drainage to incision site. No braces. Voiding PVrs low. +BS. No IVF. IV patency verified. Ambulating independently.Pt educated on safety. Safety maintained call bell and belongings within reach. Davyd Podgorski Darrelyn Hillock, RN

## 2020-01-21 NOTE — Plan of Care
Aaron Irwin was discharged via Private Car accompanied by Spouse.  Verbalized understanding of discharge instructionsand recommended follow up care as per the after visit summary.  Written discharge instructions provided. Denies any further questions. Vital signs    Vitals:  01/20/20 1759 01/20/20 2102 01/21/20 0531 01/21/20 0914 BP: 127/66 124/75 123/77 (!) 145/88 Pulse: 82 81 72 82 Resp: 18 18 16 18  Temp: 98.4 ?F (36.9 ?C) 98.2 ?F (36.8 ?C) 98 ?F (36.7 ?C) 98.2 ?F (36.8 ?C) TempSrc: Oral Oral Oral Oral SpO2: 98% 97% 98% 98% Weight:     Height:     Pt is A+O x4. POD 2 from inguinal hernia repair. States that pain is a 0/10. Denies chest pain and shortness of breath. Lung sounds clear and equal bilaterally on room air. Normoactive bowel sounds. LBM on 3/31. Tolerating a regular diet. Denies N/V. Voiding spontaneously in urinal at bedside. Dried serosanguineous drainage present on abdominal incision dressing. Denies numbness and tingling in hands and feet. SCD in place bilaterally for DVT prophylaxis. Call bell within reach and safety maintained. Patient aware and agreeable to discharge plan. Discharge education reviewed with patient. Patient verbalized understanding of teaching. Questions encouraged and answered. PIV removed. All personal belongings gathered. Plan of Care Overview/ Patient Status

## 2020-01-21 NOTE — Discharge Summary
[facility] Hospital-SrcMed/Surg Discharge SummaryPatient Data:  Patient Name: Aaron Irwin Admit date: 01/19/2020 Age: 49 y.o. Discharge date: 01/21/2020 DOB: 07/27/1971	 Discharge Attending Physician: Bobbie Stack., MD  MRN: [id number]	 Discharged Condition: good PCP: Clayborn Heron., MD Disposition: Home  Principal Diagnosis: Bilateral inguinal hernia without obstruction or gangreneOther Active Diagnoses: Present on Admission:? Bilateral inguinal hernia without obstruction or gangrene- Acute urinary retentionIssues to be Addressed Post Discharge: Issues to be Addressed Post Discharge:1.	Follow up appointmentPending Labs and Tests: Pending Lab Results   Order Current Status  Bupivacaine In process  Miscellaneous test, blood In process  [facility] miscellaneous test, refrigerated     ([facility] LMW)) In process  Tryptase In process  Follow-up Information:Passarelli, Rhona Leavens., MD2 [address] [facility] [place] [phone] on 4/19/2021at 9AM for post-operative follow-up.Ambulatory Surgical Specialties - [facility] Pat[facility] [place] [phone] Future Appointments Date Time Provider Department Center 02/07/2020  9:00 AM Bobbie Stack., MD GI [facility] COVID CAD 02/17/2020  2:00 PM Vivien Presto, [place] [facility] None 02/21/2020  8:30 AM Vivien Presto, PT REHAB [facility] None 02/24/2020  8:30 AM Vivien Presto, PT REHAB [facility] None 02/28/2020  9:30 AM Shawnee Knapp, PT REHAB [facility] None 03/02/2020  8:30 AM Vivien Presto, PT REHAB [facility] None Hospital Course: Hospital Course: Patient is 49 yr old male with no significant past medical history referred to surgeon with increasing discomfort associated with groin swelling. Patient with bilateral inguinal hernias that he elected to have surgical repair of for symptomatic nature of hernias. Patient underwent peripheral nerve block and was noted to have significant somnolence following and had planned intubation for procedure. Patient underwent robotic assisted bilateral inguinal hernia repair and was transferred to PACU for monitoring. Given pre-operative somnolence patient was admitted to the surgical service overnight for observation. He was initiated on a regular and tolerated without difficulty. Overnight patient was noted to have high volume post void residuals. He was ISC for 1L and then 500 cc. He was initiated on Flomax with Foley catheter placed with patient discomfort and removed. He was subsequently able to void with adequate bladder scans and post void residual. Urinalysis was negative. He was discharged home and he will follow up with Dr. Mariana Single in office.Inpatient Consultants and summary of recommendations:Urology Pertinent Procedures or Surgeries: Surgical and Procedural Summary this Admission   Past Procedures (01/19/2020 to Today)   Date Procedures Providers Location  01/19/2020 Bilateral Robotic Inguinal Hernia Repair----Bilateral;Bilateral Robotic Inguinal Hernia Repair----Bilateral Bobbie Stack [facility] MAIN OR    Pertinent lab findings and test results: Objective: Recent Labs Lab 03/31/210820 WBC 7.6 HGB 14.7 HCT 40.8 PLT 283  Recent Labs Lab 03/31/210820 NEUTROPHILS 39.2  Recent Labs Lab 03/31/210820 NA 134 CL 100 CO2 26 BUN 8 CREATININE 0.90 GLU 178* ANIONGAP 8  Recent Labs Lab 03/31/210820 CALCIUM 8.3* MG 1.9 PHOS 3.1  Recent Labs Lab 03/31/210820 ALKPHOS 53 - 53 BILITOT 0.4 - 0.5 No results for input(s): PTT, LABPROT, INR in the last 168 hours. Culture Information:No results for input(s): LABBLOO, LABURIN, LOWERRESPIRA in the last 168 hours.Diet: RegularMobility: Highest Level of mobility - ACTUAL: Mobility Level 8, Walk 250+ feet AM PAC 24PT Disposition Recommendation:   Physical Exam Discharge vitals: Temp:  [98 ?F (36.7 ?C)-98.5 ?F (36.9 ?C)] 98 ?F (36.7 ?C)Pulse:  [72-94] 72Resp:  [16-18] 16BP: (123-142)/(66-84) 123/77SpO2:  [97 %-99 %] 98 %Device (Oxygen Therapy): room air Discharge Physical Exam:Physical Exam GEN: NADNEURO: Alert, resting in bed comfortably, responding appropriatelyHEENT:  NCATPULM: No respiratory distress,  breathing comfortably on RACV: Regular rateABD: Soft, nondistended, nontender, 3 port site incisions c/d/iEXT: Moves all extremities equally, warm and well perfusedAllergies No Known Allergies PMH PSH Past Medical History: Diagnosis Date ? Scar   Past Surgical History: Procedure Laterality Date ? KNEE ARTHROCENTESIS    Social History Family History Social History Tobacco Use ? Smoking status: Never Smoker ? Smokeless tobacco: Never Used Substance Use Topics ? Alcohol use: Yes   Comment: social  Family History Problem Relation Age of Onset ? Hypertension Mother  ? Diabetes Neg Hx  ? Cancer Neg Hx  ? Heart disease Neg Hx   Discharge Medications: Discharge: Current Discharge Medication List  START taking these medications  Details acetaminophen (TYLENOL) 325 mg tablet Take 2 tablets (650 mg total) by mouth every 6 (six) hours as needed for up to 10 days.Qty: 30 tablet, Refills: 11Start date: 01/20/2020, End date: 01/30/2020  oxyCODONE (ROXICODONE) 5 mg Immediate Release tablet Take 1 tablet (5 mg total) by mouth every 4 (four) hours as needed.Qty: 12 tablet, Refills: 0Start date: 01/20/2020 senna-docusate (SENNA-PLUS) 8.6-50 mg tablet Take 1 tablet by mouth nightly.Qty:  Start date: 01/20/2020  tamsulosin (FLOMAX) 0.4 mg 24 hr capsule Take 1 capsule (0.4 mg total) by mouth nightly.Qty: 30 capsule, Refills: 0Start date: 01/20/2020   CONTINUE these medications which have NOT CHANGED  Details atorvastatin (LIPITOR) 10 mg tablet Take 10 mg by mouth daily.  ibuprofen (ADVIL,MOTRIN) 800 MG tablet Take 1 tablet (800 mg total) by mouth every 8 (eight) hours as needed.Rob Bunting: 20 tablet, Refills: 0   STOP taking these medications   UNABLE TO FIND    Electronically Signed:Ona Roehrs, PA 01/21/2020 1:59 PM

## 2020-01-21 NOTE — Progress Notes
[facility]aron Irwin [facility]	Surgery Progress NoteAttending Provider: Bobbie Stack., MD Admit Date: 3/31/2021Hospital Day: 32 Days Post-Op Procedure(s) (LRB):Bilateral Robotic Inguinal Hernia Repair (Bilateral)Bilateral Robotic Inguinal Hernia Repair (Bilateral)Admission History Aaron Irwin is a 49 y.o. male who is now s/p bilateral inguinal hernia repair (robotic-assisted) on 3/31, who was admitted due to increased somnolence pre-operatively after a paravertebral block.Subjective/Interim History: ?	Bladder discomfort after voids overnight, although voiding spontaneously.?	Pain well controlled?	Diet Regular, no N/V?	Ambulatory and voiding without difficulty?	No other concerns at the momentObjective: Temp:  [98 ?F (36.7 ?C)-98.5 ?F (36.9 ?C)] 98 ?F (36.7 ?C)Pulse:  [72-94] 72Resp:  [16-18] 16BP: (123-142)/(66-84) 123/77SpO2:  [97 %-99 %] 98 %Device (Oxygen Therapy): room airI/O last 3 completed shifts:In: 480 [P.O.:480]Out: 4925 [Urine:4925]Physical Exam: GEN: NADNEURO: Alert, resting in bed comfortably, responding appropriatelyHEENT:  NCATPULM: No respiratory distress, breathing comfortably on RACV: Regular rateABD: Soft, nondistended, nontender, 3 port site incisions c/d/iEXT: Moves all extremities equally, warm and well perfusedLabs CBCLab Results Component Value Date  WBC 7.6 01/19/2020  HGB 14.7 01/19/2020  HCT 40.8 01/19/2020  PLT 283 01/19/2020 LYTESLab Results Component Value Date  NA 134 01/19/2020  K  01/19/2020    Comment:    Sample Hemolyzed.   CL 100 01/19/2020  CO2 26 01/19/2020  CREATININE 0.90 01/19/2020  BUN 8 01/19/2020  GLU 178 (H) 01/19/2020  CALCIUM 8.3 (L) 01/19/2020  MG 1.9 01/19/2020  PHOS 3.1 01/19/2020 LFTsLab Results Component Value Date  BILITOT 0.5 01/19/2020  BILITOT 0.4 01/19/2020  BILIDIR  01/19/2020 Comment:    Sample Hemolyzed.   AST  01/19/2020    Comment:    Sample Hemolyzed.   AST  01/19/2020    Comment:    Sample hemolyzed.  ALT  01/19/2020    Comment:    Sample hemolyzedCalcium dobesilate can cause artificially low ALT results at therapeutic concentrations  ALT  01/19/2020    Comment:    Sample hemolyzed.Calcium dobesilate can cause artificially low ALT results at therapeutic concentrations  ALKPHOS 53 01/19/2020  ALKPHOS 53 01/19/2020 PT/INR/PTTNo results found for: INR, PTTImaging over last 24 hours: No results found.Assessment / Plan Aaron Irwin is a 49 y.o. male s/p b/l robotic IHR (3/31). Course complicated by urinary retention requiring straight catheterizations initially. Foley placement not well tolerated; however, he has been voiding on his own with residual bladder spasms. Will continue void trials. - Pain control: APAP, oxy 5/10 prn.- Diet: Diet Regular- Senna+, miralax, flomax- OOB/IS/pulmonary toilet- Resume appropriate home meds- Monitor I/Os- Dispo planning: Home pending improvement in urinary pain.Signed:David G Su, MD4/2/20216:18 AMAttending Addendum:  I have reviewed the above note.  He is now voiding well and PVRs are low.  He can go home.

## 2020-01-24 ENCOUNTER — Ambulatory Visit: Admit: 2020-01-24 | Payer: MEDICAID | Attending: Adult Health | Primary: Internal Medicine

## 2020-01-26 ENCOUNTER — Ambulatory Visit
Admit: 2020-01-26 | Payer: PRIVATE HEALTH INSURANCE | Attending: Rehabilitative and Restorative Service Providers" | Primary: Internal Medicine

## 2020-01-27 LAB — MISCELLANEOUS TEST, BLOOD      (BH GH LMW YH): BKR TEST CODE: 469

## 2020-01-27 LAB — QUEST CHANTILLY MISCELLANEOUS TEST, REFRIGERATED     (BH GH LMW YH)

## 2020-01-27 LAB — TRYPTASE: BKR TRYPTASE LEVEL: 3.2 ug/L (ref <11.0)

## 2020-01-31 ENCOUNTER — Encounter: Admit: 2020-01-31 | Payer: PRIVATE HEALTH INSURANCE | Attending: Surgery | Primary: Internal Medicine

## 2020-01-31 LAB — BUPIVACAINE     (YH)

## 2020-02-01 ENCOUNTER — Ambulatory Visit
Admit: 2020-02-01 | Payer: PRIVATE HEALTH INSURANCE | Attending: Rehabilitative and Restorative Service Providers" | Primary: Internal Medicine

## 2020-02-04 ENCOUNTER — Encounter: Admit: 2020-02-04 | Payer: PRIVATE HEALTH INSURANCE | Attending: Surgery | Primary: Internal Medicine

## 2020-02-07 ENCOUNTER — Encounter: Admit: 2020-02-07 | Payer: MEDICAID | Attending: Surgery | Primary: Internal Medicine

## 2020-02-07 DIAGNOSIS — K4020 Bilateral inguinal hernia, without obstruction or gangrene, not specified as recurrent: Secondary | ICD-10-CM

## 2020-02-07 NOTE — Progress Notes
Pt returns three weeks following a bilateral robotic inguinal hernia repair.  He had indirect hernias on both sides.  He had urinary retention post op however he is now urinating without difficulty.  He is having one formed stool a day.  He has no pain.  On exam the wounds are well healed and the repairs are firm.  His only complaint is of flatulence however this appears to be improving.  He can now increase his activity as tolerated and he can return to work tomorrow.  Return here as necessary.

## 2020-02-08 ENCOUNTER — Encounter: Admit: 2020-02-08 | Payer: PRIVATE HEALTH INSURANCE | Attending: Internal Medicine | Primary: Internal Medicine

## 2020-02-08 DIAGNOSIS — L989 Disorder of the skin and subcutaneous tissue, unspecified: Secondary | ICD-10-CM

## 2020-02-08 DIAGNOSIS — Z1211 Encounter for screening for malignant neoplasm of colon: Secondary | ICD-10-CM

## 2020-02-10 ENCOUNTER — Telehealth: Admit: 2020-02-10 | Payer: PRIVATE HEALTH INSURANCE | Primary: Internal Medicine

## 2020-02-10 ENCOUNTER — Encounter: Admit: 2020-02-10 | Payer: PRIVATE HEALTH INSURANCE | Attending: Internal Medicine | Primary: Internal Medicine

## 2020-02-10 DIAGNOSIS — R140 Abdominal distension (gaseous): Secondary | ICD-10-CM

## 2020-02-10 DIAGNOSIS — R109 Unspecified abdominal pain: Secondary | ICD-10-CM

## 2020-02-10 NOTE — Telephone Encounter
Advised pt procedure referral is still being triaged.

## 2020-02-11 ENCOUNTER — Inpatient Hospital Stay: Admit: 2020-02-11 | Discharge: 2020-02-11 | Payer: MEDICAID | Primary: Internal Medicine

## 2020-02-11 DIAGNOSIS — Z20822 Contact with and (suspected) exposure to covid-19: Secondary | ICD-10-CM

## 2020-02-11 DIAGNOSIS — Z20828 Contact with and (suspected) exposure to other viral communicable diseases: Secondary | ICD-10-CM

## 2020-02-11 LAB — COVID-19 CLEARANCE OR FOR PLACEMENT ONLY: BKR SARS-COV-2 RNA (COVID-19) (YH): NOT DETECTED

## 2020-02-13 ENCOUNTER — Ambulatory Visit: Admit: 2020-02-13 | Payer: PRIVATE HEALTH INSURANCE | Primary: Internal Medicine

## 2020-02-13 DIAGNOSIS — Z23 Encounter for immunization: Secondary | ICD-10-CM

## 2020-02-16 ENCOUNTER — Inpatient Hospital Stay: Admit: 2020-02-16 | Discharge: 2020-02-16 | Payer: MEDICAID | Primary: Internal Medicine

## 2020-02-16 DIAGNOSIS — M25561 Pain in right knee: Secondary | ICD-10-CM

## 2020-02-16 DIAGNOSIS — M7918 Myalgia, other site: Secondary | ICD-10-CM

## 2020-02-16 NOTE — Other
REHABILITATION SERVICES AT [facility] [phone] [facility] [address]Gu[address] [place] [address]YQMVH Number: [phone] Number: [id number] Therapy Daily Note4/28/2021Patient Name:  Aaron Irwin Record Number:  [id number] Date of Birth:  1972-11-23Therapist:  Vivien Presto, PTReferring Provider:  Glennie Hawk, PAICD-10 Diagnosis(es):Problem List         ICD-10-CM   PT rhomboid pain  * (Principal) Rhomboid pain M79.18  Right knee pain M25.561  General InformationTherapy Episode of Care  Date of Visit:  02/16/2020   Treatment Number:  2   Start of Care Date:  01/17/2020   Progress Report Due Date:  02/17/2020   MD Order Renewal Date:  03/18/2020 Precautions/Limitations   Precautions/Limitations:  Standard precautionsMedication Review:Current Outpatient Medications Medication Sig ? atorvastatin Take 10 mg by mouth daily. ? ibuprofen Take 1 tablet (800 mg total) by mouth every 8 (eight) hours as needed.. (Patient not taking: Reported on 01/17/2020) ? oxyCODONE Take 1 tablet (5 mg total) by mouth every 4 (four) hours as needed. ? senna-docusate Take 1 tablet by mouth nightly. ? tamsulosin Take 1 capsule (0.4 mg total) by mouth nightly. SubjectiveI had my hernia surgery. That is recovering well but I had too much anaesthesia so I was in the hospital for 3 days. I have been doing the exercises with the bands because they said that was ok and I haven't had any upper back pain. For some reason, my right knee has been giving me excruciating pain. Sometimes when I bend my knee I feel like I can't even stand on it. Sometimes, I can walk fine and its ok, and sometimes I can't walk barely at all. The first time I felt it was kneeling down to get something off the floor.As of last Monday I have no restrictions with exercise. I have been walking 5 miles a day since I was allowed to. Monday I decided to walk up [place] giant. I felt some knee pain but it was tolerable. The pain is usually below the kneecap and the inside of the knee. I have a sleeve compression brace and I've been wearing it, and that helps a bit. ObjectiveKnee AROM 0-120 R with ERP in bendingTreatment Provided This VisitCPT Code Interventions Timed Minutes Untimed Minutes Total Minutes Therapeutic Exercise (97110) Heel slides R 1x10, then 1x10 with McConnell tape onSLR 1x10 RSLR with ER 1x10 RLAQ 1x10 RUpright bike seat 10 level 1 times 5 minutes at end of sessionHEP updated on medbridge 20   Manual Therapy (97140) Patella mobs all directions RMWM medial glide patella with heel slide 1x10McConnell taping medial glide patella 10   N/A     N/A       Total Treatment Time: 30 ?	Access Code: [email or web address] knee pain likely a patellofemoral issue.  Home program updated to include gentler exercises and advised patient to ice as needed for pain/swelling.PlanFrequency:  2x per weekPlan for Next VisitContinue to work on regaining knee flexion with slow introduction of closed chain exercises as tolerated.

## 2020-02-16 NOTE — Other
REHABILITATION SERVICES AT [facility] At [phone] [facility] [address] [place] [address] Number: [phone] Number: [phone] Therapy Evaluation and Plan of Care3/29/2021Patient Name:  Aaron GarciaMR#:  [id number] Date of Birth:  22-Jan-1972Referring Provider:  Glennie Hawk, PAReferring Provider NPI:  [id number] Therapist:  Vivien Presto, PTProblem List         ICD-10-CM   PT rhomboid pain  * (Principal) Rhomboid pain M79.18  Right knee pain M25.561  Your signature indicates that you have reviewed and agree with the established Plan of Care dated 01/17/20.  This document serves to support medical necessity for continued outpatient Physical Therapy services until 03/18/20.Please co-sign this document electronically or return via the fax number listed on the document.Additional Physician Comments:___________________________________     __________________________________Physician Signature                                           Date___________________________________Physician Printed NamePhysical Therapy Orthopedic Evaluation3/29/2021Patient Name:  Aaron Irwin Record Number:  [id number] Date of Birth:  [birth year]Therapist:  Vivien Presto, PTReferring Provider:  Glennie Hawk, PAICD-10 Diagnosis(es):Problem List         ICD-10-CM   PT rhomboid pain  * (Principal) Rhomboid pain M79.18  Right knee pain M25.561  General InformationTherapy Episode of Care  Date of Visit:  01/17/2020   Treatment Number:  1   Date the Treatment Plan was Initiated/Reviewed:  01/17/2020  Start of Care Date:  01/17/2020   Progress Report Due Date:  02/17/2020   MD Order Renewal Date:  03/18/2020 Precautions/Limitations   Precautions/Limitations:  Standard precautionsCognition / Learning Assessment   Primary Learner Relationship:  Patient        Barriers to learning:  No barriers        Preferred language:  EnglishI reviewed the Patient Care Agreement and Attendance Form with the Patient/Family.  The Patient/Family verbalized understanding.Medication Review:Current Outpatient Medications Medication Sig ? atorvastatin Take 10 mg by mouth daily. ? ibuprofen Take 1 tablet (800 mg total) by mouth every 8 (eight) hours as needed.. ? UNABLE TO FIND Med Name: SLIMVANCE -XP  FOR WEIGHT LOSS SubjectivePt had a good run with no knee pain for quite awhile after meniscus surgery in 2005. He had fluid drained once but other than that he has not had other issues with his knee. In 2021, he has noticed he is having some nagging pain with the knee and is unsure why. Pain is insidious. It looks different visually than the other side. Generalized achiness in the right knee.  Rest makes it better and he is unsure of aggravating factors. Pt is also referred for upper back pain. He has some pain between his shoulderblade and his spine on the left side. HE found some stretches (thoracic rotation) on youtube and it did help with the pain. It will sometimes get flared up and then the pain comes back. He can't sleep on his left side anymore because it hurts if he does that. It flares up approximately once a week, and then he does the exercises again and its not getting beyond that point. He did have some tingling in the left arm but he has not had that in awhile. 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 Scar  Past Surgical HistoryPast Surgical History: Procedure Laterality  Date ? KNEE ARTHROCENTESIS   AllergiesPatient has no known allergies.Pain Rating:  6-7 / 10 knee pain R   Comments:  Not waking due to pain at nightNo unexplained weight changes - has lost 10-12lbs eating healthier since the new year. Has been trying a posture corrector 6-8 hours/day for the last 2 weeksHe works out daily with dumbbells: He feels good when he works out. Recently also bought a vibrating platform and he does some squats on there. It goes for 10 minutesSocial / Emotional Information:  Lives with familyPrior Level of Function:  I ADLS   Change in Status from prior level of function?  No but pain at timesObjectivePosture: Noted atrophy of the distal quadricepsMedial joint line irregularity BMMT/ROM:Cervical and thoracic ROM full and pain freeLower Quadrant Manual Muscle Test Right Left Hip Flexion 5 5 Hip External Rotation 4+ 4- Hip Internal Rotation   Knee Extension 5 5 Knee Flexion 5 5 Dorsiflexion 5 5 Eversion   Hip ABduction 3+ 5 Hip ADduction   Hip Extension 4- 4- PF 5 5    Joint Mobility:Hypomobility:  Mild through thoracic spine, no pain reproducedPatella mobility WNLPalpation:Increased tone L thoracic paraspinalsMidback MMTLats L 4- pain, 5/5 RMid trap 3+/5 L, 4+ RRhomboids 4-/5 R, 4+ RLow trap 3+ L with pain, 4+ RSpecial TestsKnee Tests Right Left Lachman -  Anterior Drawer -  Posterior Drawer -  Thessaly   Varus Stress -  Valgus Stress -  McMurray's -  Patellar Apprehension   Patellar Compression   Ober's -  Thomas   Ely's -        Good HS flexibility on RFunctional MobilityBed Mobility:  ITransfers:  IAmbulation:  I     Assistive Device:  NoneGait AssessmentunremarkableOrthopedic Tools/Scales/Outcome MeasuresLower Extremity Functional Scale      Any of your usual work/household/school activities:  4      Your usual hobbies/recreation/sporting activities:  4      Getting into or out of the bath:  4      Walking between rooms:  4      Putting on your socks:   4 Squatting:  4      Lifting an object from the floor:  4      Performing light activities around your home:  4      Performing heavy activities around your home:  4      Getting into or out of your car:  4      Walking 2 blocks:  4      Walking a mile:  4      Go up or down 10 stairs:  4      Standing for 1 hour:  4      Sitting for 1 hour:  4      Running on even ground:  4      Running on uneven ground:  4      Making sharp turns while running fast:  4      Hopping:  4      Rolling over in bed:  4   Total Score (out of a possible 80):  80   76-78/80 is normal for population   Minimal detectable change = 9 points   Minimal clinically important difference = 9 points   Potential error +/- 5.3 pointsOswestry Low Back Pain Scale      Pain intensity:  3      Personal care:  0      Lifting:  0  Walking:  0      Sitting:  0      Standing:  0      Sleeping:  1      Social life:  0      Traveling:  0      Changing degree of pain:  3   Score:  14 % impairmentsTreatment Provided This VisitCPT Code Interventions Timed Minutes Untimed Minutes Total Minutes Physical Therapy Evaluation - High Complexity [phone]) Evaluation performed for left mid back pain and right knee pain 55   Therapeutic Exercise (97110) See exercises below given for home program; patient is having hernia surgery on the 31st so he was instructed that he needs to speak with his doctor regarding precautions before continuing to do any exercises following his surgery 10   N/A     N/A       Total Treatment Time: 60 Access Code: [email or web address]: [email or web address]: 03/29/2021Prepared by: Consuella Lose HORNExercises Sidelying Hip Abduction - 1 x daily - 7 x weekly - 10 reps - 3 sets Clamshell - 1 x daily - 7 x weekly - 10 reps - 3 sets Supine Bridge - 1 x daily - 7 x weekly - 10 reps - 3 sets Standing Row with Anchored Resistance - 1 x daily - 7 x weekly - 3 sets - 10 reps Shoulder extension with resistance - Neutral - 1 x daily - 7 x weekly - 10 reps - 3 sets Shoulder External Rotation and Scapular Retraction with Resistance - 1 x daily - 7 x weekly - 3 sets - 10 repsProblem ListRight knee painLeft midback painWeakness of right glutes particularly into hip extension, abduction, external rotationWeakness of midback musculature on left sideAssessmentThis is a 49 year old male presenting to PT with subjective complaints of left mid back and right knee pain.  On exam, he has significant weakness on the left side compared to the right through his midback musculature, and on the right side, throughout his hip musculature.  He had a meniscectomy approximately 16 years ago, which might predispose to mild OA as well.  He will benefit from a course of therapy to strengthen weak musculature and facilitate improved postural awareness to return to prior level of function.Patient / Family / Caregiver EducationDiscussed role of therapyDiscussed the presenting problemReviewed the assessmentDiscussed plan of care and rationalePatient/Family/Caregiver demonstrate agreement with the planWritten materials / instruction providedRehab PotentialGoodPlanTherapeutic Exercise (97110)Manual Therapy (97140)Therapeutic Activity (97530)Self-Care/Home Management (97535)Gait Training (97116)Neuromuscular Re-Education (97112)Heat - Ice Pack Frequency:  2x per weekDuration:  2 months * 1st follow-up appointment will not be made until after his MD follow-up on April 19th, as he is having hernia repair on March 31st and will have postoperative restrictions; patient advised that he needs to get any restrictions in writing after the 2 week period and bring a [facility] for Next VisitProgress with right hip and left midback strengthening, core as able once hernia restrictions lifted.GoalsLong term goals:Midback musculature strength to 4-/5 or greater low trap, 4+ out of 5 or greater for mid trap, Lat, rhomboid in 2 months.Strength of right hips to 4+ out of 5 for abduction, extension, external rotation or greater in 2 months to facilitate performing all functional activities with the right lower extremity without pain.Patient will be able to sleep on his left side without midback pain in 2 months.Short-term goals:Independent with home exercise program with good compliance in 3 weeks.

## 2020-02-17 ENCOUNTER — Ambulatory Visit
Admit: 2020-02-17 | Payer: PRIVATE HEALTH INSURANCE | Attending: Rehabilitative and Restorative Service Providers" | Primary: Internal Medicine

## 2020-02-17 ENCOUNTER — Ambulatory Visit: Admit: 2020-02-17 | Payer: MEDICAID | Attending: Gastroenterology | Primary: Internal Medicine

## 2020-02-17 ENCOUNTER — Ambulatory Visit: Admit: 2020-02-17 | Payer: MEDICAID | Attending: Registered" | Primary: Internal Medicine

## 2020-02-17 ENCOUNTER — Encounter: Admit: 2020-02-17 | Payer: PRIVATE HEALTH INSURANCE | Attending: Gastroenterology | Primary: Internal Medicine

## 2020-02-17 ENCOUNTER — Telehealth: Admit: 2020-02-17 | Payer: PRIVATE HEALTH INSURANCE | Attending: Gastroenterology | Primary: Internal Medicine

## 2020-02-17 DIAGNOSIS — R143 Flatulence: Secondary | ICD-10-CM

## 2020-02-17 DIAGNOSIS — Z1211 Encounter for screening for malignant neoplasm of colon: Secondary | ICD-10-CM

## 2020-02-17 DIAGNOSIS — R140 Abdominal distension (gaseous): Secondary | ICD-10-CM

## 2020-02-17 DIAGNOSIS — G8929 Other chronic pain: Secondary | ICD-10-CM

## 2020-02-17 DIAGNOSIS — R109 Unspecified abdominal pain: Secondary | ICD-10-CM

## 2020-02-17 DIAGNOSIS — Z713 Dietary counseling and surveillance: Secondary | ICD-10-CM

## 2020-02-17 DIAGNOSIS — L905 Scar conditions and fibrosis of skin: Secondary | ICD-10-CM

## 2020-02-17 MED ORDER — ATORVASTATIN 10 MG TABLET
10 mg | Status: AC
Start: 2020-02-17 — End: ?

## 2020-02-17 NOTE — Telephone Encounter
Pt checking to see if there were morning cancellations - advised no

## 2020-02-17 NOTE — Progress Notes
[place]ology Clinic NoteHistory of Present Illness: CC: abdominal distention and flatusHPI: Aaron Irwin is a 49 year old man with a history of an inguinal hernia surgery on 01/19/20 who presents to the [facility] for further evaluation of abdominal distention with cramping pain and increased flatus which started after surgery. He reports having complications following anesthesia including having a difficult time waking up after the surgery as well as urinary retention and a delay in the return of bowel function for which he required 2 days in the hospital. Prior to discharge, he was able to urinate independently and his bowel function had returned. However, since the surgery, he was initially having difficulty passing gas but, more recently, he is having problems with increased flatus, difficulty determining if he needs to pass stool or gas and an inability to retain the gas. He describes an intermittent sensation of abdominal pain or discomfort when he feels he needs to pass gas. Since the beginning of the year, he has been eating a healthier diet, exercising regularly, avoids sodas, and has intentionally lost about 10 lbs. He has not changed his diet since the surgery and he is resumed exercising. He is having regular bowel movements that are formed, non-watery, non-bloody.Review of Systems: Constitutional: Negative for fever, sweats, fatigue and unexpected weight change. Skin: Negative for rashes, ulcers. HEENT: Negative for vision changes, eye pain. No difficulty swallowing. Cardiovascular: Negative for chest pain, palpitations and leg swelling. Respiratory: Negative for cough and shortness of breath or DOE Gastrointestinal: as outlined in the history of present illness Genitourinary: Negative for dysuria, hematuria, frequency and difficulty urinating. Musculoskeletal: Negative for joint pains, joint swelling, joint redness, myalgias and gait problem. Neurological: Negative for dizziness, syncope, weakness, headaches, light-headedness. Psychiatric: Negative for depression and anxietyAll other systems are negative. Outpatient Medications:Current Outpatient Medications on File Prior to Visit Medication Sig ? atorvastatin (LIPITOR) 10 mg tablet Take 10 mg by mouth daily. ? ibuprofen (ADVIL,MOTRIN) 800 MG tablet Take 1 tablet (800 mg total) by mouth every 8 (eight) hours as needed.. (Patient not taking: Reported on 01/17/2020) ? oxyCODONE (ROXICODONE) 5 mg Immediate Release tablet Take 1 tablet (5 mg total) by mouth every 4 (four) hours as needed. ? senna-docusate (SENNA-PLUS) 8.6-50 mg tablet Take 1 tablet by mouth nightly. ? tamsulosin (FLOMAX) 0.4 mg 24 hr capsule Take 1 capsule (0.4 mg total) by mouth nightly. No current facility-administered medications on file prior to visit.  Allergies: No Known AllergiesPast Medical History:Past Medical History: Diagnosis Date ? Scar  Past Surgical History:Past Surgical History: Procedure Laterality Date ? KNEE ARTHROCENTESIS    Family History:Family History Problem Relation Age of Onset ? Hypertension Mother  ? Diabetes Neg Hx  ? Cancer Neg Hx  ? Heart disease Neg Hx  IBD: NoneCRC: None Social History:Social History Tobacco Use ? Smoking status: Never Smoker ? Smokeless tobacco: Never Used Substance Use Topics ? Alcohol use: Yes   Comment: social Occupation: Currently unemployed, married with 2 children (1 boy and 1 girl)Smoking: NoneAlcohol: none since last yearIllicit drugs: noneObjective: Vitals:  02/17/20 1358 BP: 110/72 Site: Left Arm Position: Sitting Cuff Size: Large Pulse: 62 Temp: 98.3 ?F (36.8 ?C) SpO2: 99% Weight: 95.3 kg Height: 6' 1 (1.854 m) Physical Exam: Gen: NAD, well-appearingHEENT: face mask in place, no scleral icterusPULM: CTAB, no wheezes, rales or rhonchiCV: regular rhythm, no murmurs, rubs or gallopsGI: soft, non-tender, not distended, normal active bowel sounds, well healed surgical port sites without surrounding erythema or indurationSkin: Warm, dry no  rashesNeuro: No focal deficitsMusculoskeletal: No joint swelling or redness; gait is normalExtremities: LE without edema. Warm, well perfused extremities.Pertinent Labs:Lab Results Component Value Date  WBC 7.6 01/19/2020  HGB 14.7 01/19/2020  HCT 40.8 01/19/2020  MCV 84.6 01/19/2020  PLT 283 01/19/2020   Chemistry  Lab Results Component Value Date  NA 134 01/19/2020  K  01/19/2020    Comment:    Sample Hemolyzed.   CL 100 01/19/2020  CO2 26 01/19/2020  BUN 8 01/19/2020  CREATININE 0.90 01/19/2020  GLU 178 (H) 01/19/2020  Lab Results Component Value Date  CALCIUM 8.3 (L) 01/19/2020  ALKPHOS 53 01/19/2020  ALKPHOS 53 01/19/2020  AST  01/19/2020    Comment:    Sample Hemolyzed.   AST  01/19/2020    Comment:    Sample hemolyzed.  ALT  01/19/2020    Comment:    Sample hemolyzedCalcium dobesilate can cause artificially low ALT results at therapeutic concentrations  ALT  01/19/2020    Comment:    Sample hemolyzed.Calcium dobesilate can cause artificially low ALT results at therapeutic concentrations  BILITOT 0.5 01/19/2020  BILITOT 0.4 01/19/2020  Imaging:No abdominal imagingPrior Endoscopy: Colonoscopy scheduled for 6/2/21Impression and Recommendations:  Aaron Irwin is a 49 year old man who developed an increase in flatus with associated abdominal discomfort.1. Increased flatus: -It is very likely that his change in flatus and intermittent abdominal distention/discomfort may be due to the delayed return of bowel function following anesthesia, however it is uncommon for any GI symptoms to persist for this long. -It is also possible that his healthy diet may include a large amount of fiber or FODMAPs, which can lead to increased gas production during digestion. We discussed a trial of a low FODMAP diet. -He also met with Renie Ora today to discuss dietary changes in detail. I appreciate Ms. Wilson's assistance in the care of this patient. 2. Inability to Differentiate gas from stool:-Unclear exact etiology but seems to have been triggered by his prior surgery.-I have informed him that if his symptoms do not continue to improve, the next step in his evaluation would be with a colonoscopy, especially as he is 49 years old and due for colon cancer screening anyway. 3. Colon Cancer screening:-He is already scheduled for a colonoscopy on 03/22/20. He has some concerns about undergoing sedation for the colonoscopy as he fears it may lead to the same side effects of urinary retention and delay in bowel function.-He also expressed concern that the air used for insufflation during the procedure may worsen his already worsened flatus.- I have informed him that the anesthesia used for colonoscopy is different than what is used for a surgery. Also, anesthesia will review his history prior to the colonoscopy and will make adjustments in the anesthesia plan as needed. -If needed, the endoscopist can use CO2 for insufflation instead of air as this may lead to less abdominal cramping following the procedure. 4. Follow-up:-He elected not to schedule a follow-up visit at this time. He will contact me if his symptoms persist despite dietary changes and after the colonoscopy.Signed:Kelsie Zaborowski, MDAssociate Professor, Section of Digestive Diseases Director of Clinical Research, [place] [facility]

## 2020-02-18 ENCOUNTER — Encounter: Admit: 2020-02-18 | Payer: PRIVATE HEALTH INSURANCE | Attending: Surgery | Primary: Internal Medicine

## 2020-02-18 DIAGNOSIS — M25561 Pain in right knee: Secondary | ICD-10-CM

## 2020-02-21 ENCOUNTER — Inpatient Hospital Stay: Admit: 2020-02-21 | Discharge: 2020-02-21 | Payer: MEDICAID | Primary: Internal Medicine

## 2020-02-21 DIAGNOSIS — M25561 Pain in right knee: Secondary | ICD-10-CM

## 2020-02-21 NOTE — Other
REHABILITATION SERVICES AT [facility] At [id number] [facility] [address] [place] [address] Number: [id number] Number: [id number] Therapy No Show Note5/3/2021Patient Name:  Aaron Irwin Record Number:  [id number] Date of Birth:  Jan 05, 1972Therapist:  Vivien Presto, PTMarc Borgwardt was a no show for an appointment today. I called and he was on his way but was going to be at least 25 minutes late. Advised to come to next scheduled appointment as that would not leave enough time to treat him. He was given the front desk number to call to r/s.

## 2020-02-24 ENCOUNTER — Encounter
Admit: 2020-02-24 | Payer: PRIVATE HEALTH INSURANCE | Attending: Rehabilitative and Restorative Service Providers" | Primary: Internal Medicine

## 2020-02-24 ENCOUNTER — Inpatient Hospital Stay: Admit: 2020-02-24 | Discharge: 2020-02-24 | Payer: MEDICAID | Primary: Internal Medicine

## 2020-02-24 DIAGNOSIS — M7918 Myalgia, other site: Secondary | ICD-10-CM

## 2020-02-24 DIAGNOSIS — M25561 Pain in right knee: Secondary | ICD-10-CM

## 2020-02-24 NOTE — Other
REHABILITATION SERVICES AT [facility] At [phone] [facility] [address] [place] [address] Number: [id number] Number: [id number] Therapy Cancel Note5/6/2021Patient Name:  Aaron Irwin Record Number:  [id number] Date of Birth:  12/14/1972Therapist:  Vivien Presto, PTMarc Felipa Furnace cancelled today's appointment. (car broke down)

## 2020-02-24 NOTE — Progress Notes
Nutritional AssessmentDate of Visit:  02/17/2020                                       Nutritionist/Provider:  Vladimir Crofts, RD, CDELocation of Visit: DIGESTIVE DISEASE Type of Visit: Initial  Referred by:  Dr. Biagio Borg) for Referral:  bloating, low fodmap diet NUTRITION RELATED DATAPast Medical History:  Past Medical History: Diagnosis Date ? Scar  Psychosocial History:Cultural/social practices and Social support system:  No limiting factorsMedications and Supplements:Reviewed and include (but not limited to): nonelipitorTo start senna Food Allergies/Intolerances:  None.Biochemical Data:Nutrition Related Laboratory Findings:  All reviewed, 01/19/20: gluc 178, alb 3.9, Hg 14.7, Hct 40.8NUTRITION ASSESSMENTNutrition Focused Physical Exam: Global assessment:  Appears well nourishedLoss of subcutaneous fat/muscle wasting: none notedEdema/Ascites: none notedSigns/symptoms:Gastrointestinal:  Bloating/gasAppetite:  normalFunctional Impairment: none notedOther: N/AAnthropometrics:Weight: 210 lb                                                            Height: 73   BMI: 27.8IBW (BMI range 19-<25): 189 lb                                                               Weight Change: recent 7 lb wt gain notedWt Readings from Last 6 Encounters: 02/17/20 95.3 kg 01/19/20 92.3 kg 09/28/16 99.8 kg Physical Activity:  No limitations notedNutrition Prescription:Needs based on actual weightCalories:  2566 (MSJE x 1.375)Protein:  76 - 95 gm (.8-1 gm/kg)Other Nutrients:  RDIs for gender/ageAssessment of Current Intake:Summary:  Diet hx obtained.  He notes he does not drink milk or eat yogurt, but he does have a [employer] protein shake which he likes.  He is thinking about adding muscle milk.  Other beverages include lemonade, ice tea and water.  He has no food allergies.24-Hour/Typical Day Diet Recall First Meal Second Meal Third Meal Snacks Protein shakebanana Salad with jalapeno peppers, greens, tomato, light italian dressing Salmon, baked potato w/ butter from a restaurant  Nutrition DiagnosisFood and nutrition related knowledge deficit  related to GI symptomsas evidenced by pt.expressing confusion over appropriate diet for health condition and goals.as it relates to low FODMAP diet and reducing gas/bloatingNUTRITION INTERVENTIONContent-related nutrition education and nutrition influence on health: reviewed diet hx, importance of nutritional balance, and explained possibility that limiting high fodmap foods could reduce his symptoms.  Recommended modified diet and meal planning strategies: (X next to items discussed) and Nutrition Education- Content:Low FODMAP strategies: XAdditional strategies: tips to improve food tolerance and nutritional balanceBehavioral/Counseling strategies: affirming healthy foods consumed, validating struggles GI complaints, problem solving for symptoms and management of barriers to good nutrition, goals settingCollaboration and referral of nutrition care: as needed	Patient-centered Nutritional Goals:By next visit...1. Johncharles will follow a low FODMAP diet for ~ 6 weeks2. Daisuke will check protein powder for lactose/milkReadiness for Change and Understanding:Comprehension: Yuri demonstrates understanding by stating he will avoid foods on the list without difficulty Motivation: seems engaged and motivated to feel betterBarriers to Change:  None noted Readiness/likelihood for change: preparationMONITORING AND FOLLOW UPFood/nutrition intake tracking:  pt self reported intake vs food log or phone appAnthropometric measurement: weights, BMIBiochemical data and other testing: routine labs, additionally lipids and gluc, A1CNutrition-focused physical related:  monitor for s/s deficiency or changes in conditionNext visit planned: next visit to Digestive HealthVisit Length:  25 minutesElectronically Signed by Renie Ora, RD, CDEElectronically Signed by Vladimir Crofts, RD, February 17, 2020

## 2020-02-25 ENCOUNTER — Encounter: Admit: 2020-02-25 | Payer: MEDICAID | Attending: Dermatology | Primary: Internal Medicine

## 2020-02-25 DIAGNOSIS — L639 Alopecia areata, unspecified: Secondary | ICD-10-CM

## 2020-02-25 MED ORDER — CLINDAMYCIN 1 % LOTION
1 % | Freq: Two times a day (BID) | TOPICAL | 3 refills | Status: AC
Start: 2020-02-25 — End: 2022-05-20

## 2020-02-25 NOTE — Progress Notes
Chief Complaint: new skin lesion of concernSubjective: Aaron Irwin is a 49 y.o. male who presents with a new lesion of concern. He was seen by Albertina Senegal, LPN previously, last seen in 2016.Plan at last visit: 07/11/20161. Wart B07.9  Options discussedParing of siteRepeat LN2 x 1 for wart Personal hx of skin cancer: NoneFamily hx of skin cancer: NonePrior hx of blistering sunburns: noCurrently: The patient has a lesion(s) of concern:LOC #1Location: neck, groinDuration: >2 week(s)Symptoms: irritated bump on skinChange: no specific changePrior treatment: NonePast Medical History: Diagnosis Date ? Scar  Current Outpatient Medications Medication Sig Dispense Refill ? atorvastatin (LIPITOR) 10 mg tablet 1 tablet   No current facility-administered medications for this visit.  ROS: The following were asked of the patient: No  Unanticipated change in weight	No  fevers/chillsNo  fatigueNo  new swollen lymph nodes No  recent illness/hospitatlizationNo  chest pain or shortness of breathNo  joint aches or painsThe patient's medications, allergies, problem list, and past medical, surgical and family histories were reviewed and updated as appropriate.Objective:Physical Exam -General - Well-nourished, well-developed, in no apparent distressNeck - pseudofolliculitis Groin - pseudofolliculitisAlopecia areata left beard --> ILK injAssessment/Plan:1. new lesion of concern: c/w pseudofolliculitis A. Pseudofolliculitis: neck, groin- topical antibiotic lotion- CLINDAMYCIN prescribed: apply to affected areas twice daily for 2-3 weeks then if eruption improves, may use as needed- may be helpful to let hair grow out for a while when active- consider abx if not betterB. Alopecia areata: left beard - for symptomatic relief, the patient was treated with intralesional kenalog at a concentration of 5 mg/cc, and a total of 1 cc injected- the patient was counseled on the risks of bleeding, dyspigmentation, and rare injection into a vessel/nerveFollow-up: 3 monthsKenda Proffitt 02/25/2020 12:59 PM Scribed for Irven Coe, MD by Noe Gens, medical scribe May 7, 2021Electronically Signed by Noe Gens, May 7, 2021The documentation recorded by the scribe accurately reflects the service I personally performed and the decisions made by me.

## 2020-02-25 NOTE — Patient Instructions
-   apply clindamycin lotion to the neck area and groin daily

## 2020-02-28 ENCOUNTER — Inpatient Hospital Stay: Admit: 2020-02-28 | Discharge: 2020-02-28 | Payer: MEDICAID | Primary: Internal Medicine

## 2020-02-28 DIAGNOSIS — M25561 Pain in right knee: Secondary | ICD-10-CM

## 2020-02-28 DIAGNOSIS — M7918 Myalgia, other site: Secondary | ICD-10-CM

## 2020-02-28 NOTE — Other
REHABILITATION SERVICES AT [facility] Services At [phone] [facility] [address] [place] [address] Number: [phone] Number: [phone] Therapy Daily Note5/10/2021Patient Name:  Aaron Irwin Record Number:  [id number] Date of Birth:  Jul 08, 1972Therapist:  Shawnee Irwin, PTReferring Provider:  Glennie Irwin, PAICD-10 Diagnosis(es):Problem List         ICD-10-CM   PT rhomboid pain  Rhomboid pain M79.18  Right knee pain M25.561  General InformationTherapy Episode of Care  Date of Visit:  02/28/2020   Treatment Number:  3   Start of Care Date:  01/17/2020   Progress Report Due Date:  02/17/2020   MD Order Renewal Date:  03/18/2020 Precautions/Limitations   Precautions/Limitations:  Standard precautionsMedication Review:Current Outpatient Medications Medication Sig ? atorvastatin 1 tablet ? clindamycin Apply topically 2 (two) times daily. SubjectiveI am doing all the home exercises with little difficulty.  I have tried hiking [place] and noticed I feel that the uphill is working out my calfs and quads, but on the way down is when I feel the pain in my knee cap.  My upper back has not bothered me.As of last Monday I have no restrictions with exercise. I have been walking 5 miles a day since I was allowed to. Monday I decided to walk up [place]. I felt some knee pain but it was tolerable. The pain is usually below the kneecap and the inside of the knee. I have a sleeve compression brace and I've been wearing it, and that helps a bit. ObjectiveKnee AROM 0-120 R ERP with OPTreatment Provided This VisitCPT Code Interventions Timed Minutes Untimed Minutes Total Minutes Therapeutic Exercise (97110) Heel slides R 1x10, then 1x10 with McConnell tape onReview of HEP for proper execution needing very little cuingTotal gym squats keeping weight through heels of feet 3/10Side step GTB in slight squat keeping weight through heels of feet 30 ft x 5Step up 6 step keeping weight through heels 2/10Upright bike seat 10 level 1 times 5 minutes at end of sessionHEP updated on medbridge 25   Manual Therapy (97140) Patella mobs all directions RMWM medial glide patella with heel slide 1x10McConnell taping medial glide patella 10   N/A     N/A       Total Treatment Time: 35 AssessmentDiscussed with Aaron Irwin that he should try to not do activities that exacerbate symptoms as he wanted to try to go up and down 3 flights of stairs for 3 sets to see if he still gets the same pain going down the stairs.  Suggested that he start doing all of the exercises and progressing into more standing type of strengthening prior to doing activities that he knows that are going to exacerbate symptoms.  With use of McConnell taping and adjustment of weight line to go more through heels of the foot emphasizing glutes and reducing patellofemoral pressure, this reduced patellofemoral pain to 0/10 with all exercise.  Wanted to review all exercises again before we give these as a home program.PlanFrequency:  2x per weekPlan for Next VisitProgress closed chain exercises as tolerated to focus on patellar tracking and reducing patellofemoral pressure.

## 2020-03-02 ENCOUNTER — Inpatient Hospital Stay: Admit: 2020-03-02 | Discharge: 2020-03-02 | Payer: MEDICAID | Primary: Internal Medicine

## 2020-03-02 ENCOUNTER — Ambulatory Visit: Admit: 2020-03-02 | Payer: PRIVATE HEALTH INSURANCE | Attending: Gastroenterology | Primary: Internal Medicine

## 2020-03-02 DIAGNOSIS — M25561 Pain in right knee: Secondary | ICD-10-CM

## 2020-03-02 NOTE — Other
REHABILITATION SERVICES AT [facility] Services At [phone] [facility] [address]Gu[address] [place] [address] Number: [phone] Number: [phone] Therapy Daily Note5/13/2021Patient Name:  Aaron Irwin Record Number:  [id number] Date of Birth:  [birth year]Therapist:  Shawnee Knapp, PTReferring Provider:  Glennie Hawk, PAICD-10 Diagnosis(es):Problem List         ICD-10-CM   PT rhomboid pain  Rhomboid pain M79.18  Right knee pain M25.561  General InformationTherapy Episode of Care  Date of Visit:  03/02/2020   Treatment Number:  4   Start of Care Date:  01/17/2020   Progress Report Due Date:  02/17/2020   MD Order Renewal Date:  03/18/2020 Precautions/Limitations   Precautions/Limitations:  Standard precautionsMedication Review:Current Outpatient Medications Medication Sig ? atorvastatin 1 tablet ? clindamycin Apply topically 2 (two) times daily. SubjectiveTuesday I went up and down sleeping giant without a problem. I felt good afterwards too. I've been doing the exercises and I think its just a matter of building up strength. I don't get the pain like I did before, but I can see its not all the way there. I am doing all the home exercises with little difficulty.  I have tried hiking [place] and noticed I feel that the uphill is working out my calfs and quads, but on the way down is when I feel the pain in my knee cap.  My upper back has not bothered me.As of last Monday I have no restrictions with exercise. I have been walking 5 miles a day since I was allowed to. Monday I decided to walk up [place]. I felt some knee pain but it was tolerable. The pain is usually below the kneecap and the inside of the knee. I have a sleeve compression brace and I've been wearing it, and that helps a bit. ObjectiveKnee AROM 0-125 R, no ERP with OP today but stretch feltHip abd 3/5 R- ober'sTreatment Provided This VisitCPT Code Interventions Timed Minutes Untimed Minutes Total Minutes Therapeutic Exercise (97110) Prone quad stretches 3x30 R, and added to HEPTotal gym squats keeping weight through heels of feet 2x15Step up 8 step anterior 2x10 RUpright bike seat 10 level 3 times 5 minutes at end of sessionEducated to continue every other day with lateral band walks and step ups as directed by other PT last visit, but update daily HEP to below, working up to 3x10 with SDL abd as he gets better with clamshells 22   Manual Therapy (97140) Patella mobs inferior glidesMcConnell taping medial glide patella 8   N/A     N/A       Total Treatment Time: 35 Access Code: [email or web address]: [email or web address]: 04/28/2021Prepared by: Consuella Lose HORNExercises Supine Active Straight Leg Raise - 1 x daily - 7 x weekly - 15 reps - 2 sets Straight Leg Raise with External Rotation - 1 x daily - 7 x weekly - 15 reps - 2 sets Sidelying Hip Abduction - 1 x daily - 7 x weekly - 10 reps - 3 sets Prone Quadriceps Stretch with Strap - 1 x daily - 7 x weekly - 3 sets - 30 hold Clamshell - 1 x daily - 7 x weekly - 2 sets - 15 repsAssessmentContinued to reinforce not pushing hard with strenuous activities. Good tolerance for all exs today with no pain reported, just fatigue.PlanFrequency:  2x per weekPlan for Next VisitProgress closed chain exercises as tolerated to focus on patellar tracking and reducing patellofemoral pressure.

## 2020-03-05 ENCOUNTER — Ambulatory Visit: Admit: 2020-03-05 | Payer: PRIVATE HEALTH INSURANCE | Primary: Internal Medicine

## 2020-03-05 DIAGNOSIS — Z23 Encounter for immunization: Secondary | ICD-10-CM

## 2020-03-08 ENCOUNTER — Encounter: Admit: 2020-03-08 | Payer: PRIVATE HEALTH INSURANCE | Attending: Adult Health | Primary: Internal Medicine

## 2020-03-08 DIAGNOSIS — Z01818 Encounter for other preprocedural examination: Secondary | ICD-10-CM

## 2020-03-09 ENCOUNTER — Encounter: Admit: 2020-03-09 | Payer: PRIVATE HEALTH INSURANCE | Attending: Gastroenterology | Primary: Internal Medicine

## 2020-03-09 ENCOUNTER — Ambulatory Visit: Admit: 2020-03-09 | Payer: PRIVATE HEALTH INSURANCE | Attending: Anesthesiology | Primary: Internal Medicine

## 2020-03-09 DIAGNOSIS — Z1211 Encounter for screening for malignant neoplasm of colon: Secondary | ICD-10-CM

## 2020-03-09 MED ORDER — PEG-ELECTROLYTE SOLUTION 420 GRAM ORAL SOLUTION
420 gram | Freq: Once | ORAL | 1 refills | 1.00 days | Status: AC
Start: 2020-03-09 — End: ?
  Filled 2020-03-09: qty 4000, 1d supply, fill #1
  Filled 2020-03-09: qty 4000, 1d supply, fill #0

## 2020-03-15 ENCOUNTER — Encounter: Admit: 2020-03-15 | Payer: PRIVATE HEALTH INSURANCE | Attending: Gastroenterology | Primary: Internal Medicine

## 2020-03-15 DIAGNOSIS — R200 Anesthesia of skin: Secondary | ICD-10-CM

## 2020-03-15 DIAGNOSIS — L905 Scar conditions and fibrosis of skin: Secondary | ICD-10-CM

## 2020-03-15 DIAGNOSIS — E7800 Pure hypercholesterolemia, unspecified: Secondary | ICD-10-CM

## 2020-03-15 MED ORDER — MULTIVITAMIN CAPSULE
Freq: Every day | ORAL | Status: AC
Start: 2020-03-15 — End: ?

## 2020-03-15 NOTE — Telephone Encounter
Patient is scheduled for a colonoscopy on 03/22/2020 at [facility]. Pre procedure call performed. Discharge transportation policy & NPO instructions given per pre procedure guidelines. Golytely prep reviewed with patient. Patient verbalized his understanding. Patient instructed to hold all medications on the morning of scheduled procedure. Patient verbalized his understanding.Patient is scheduled for COVID testing on 03/20/2020.

## 2020-03-16 ENCOUNTER — Encounter: Admit: 2020-03-16 | Payer: PRIVATE HEALTH INSURANCE | Attending: Gastroenterology | Primary: Internal Medicine

## 2020-03-16 ENCOUNTER — Ambulatory Visit
Admit: 2020-03-16 | Payer: PRIVATE HEALTH INSURANCE | Attending: Rehabilitative and Restorative Service Providers" | Primary: Internal Medicine

## 2020-03-16 DIAGNOSIS — M7918 Myalgia, other site: Secondary | ICD-10-CM

## 2020-03-16 DIAGNOSIS — E7800 Pure hypercholesterolemia, unspecified: Secondary | ICD-10-CM

## 2020-03-16 DIAGNOSIS — T8859XA Other complications of anesthesia, initial encounter: Secondary | ICD-10-CM

## 2020-03-16 DIAGNOSIS — M25561 Pain in right knee: Secondary | ICD-10-CM

## 2020-03-16 DIAGNOSIS — L905 Scar conditions and fibrosis of skin: Secondary | ICD-10-CM

## 2020-03-16 DIAGNOSIS — R200 Anesthesia of skin: Secondary | ICD-10-CM

## 2020-03-16 NOTE — Other
ReviewNP called and spoke with patient as requested. Patient reviewed prior anesthesia experience 3/31 for inguinal hernia repair. He reports he was lethargic and somnolent immediately after receiving block. Patient admitted for observation overnight and discharged home. NP had attending Dr. Kae Heller review to make sure patient appropriate for [facility]. Patient also requested to keep [facility] placement as he wants to get the procedure done. NP advised patient to voice any and all concerns to anesthesia team day of procedure who would be providing direct care to patient. All questions answered, patient verbalized understanding and thanked NP. He is OK for [facility]. They will titrate meds to appropriate level. SHR Review NP: Georgette Dover

## 2020-03-20 ENCOUNTER — Inpatient Hospital Stay: Admit: 2020-03-20 | Discharge: 2020-03-20 | Payer: MEDICAID | Primary: Internal Medicine

## 2020-03-20 DIAGNOSIS — Z01812 Encounter for preprocedural laboratory examination: Secondary | ICD-10-CM

## 2020-03-20 DIAGNOSIS — Z20822 Contact with and (suspected) exposure to covid-19: Secondary | ICD-10-CM

## 2020-03-20 DIAGNOSIS — M7918 Myalgia, other site: Secondary | ICD-10-CM

## 2020-03-20 DIAGNOSIS — Z01818 Encounter for other preprocedural examination: Secondary | ICD-10-CM

## 2020-03-20 LAB — COVID-19 CLEARANCE OR FOR PLACEMENT ONLY: BKR SARS-COV-2 RNA (COVID-19) (YH): NOT DETECTED

## 2020-03-22 ENCOUNTER — Encounter: Admit: 2020-03-22 | Payer: MEDICAID | Primary: Internal Medicine

## 2020-03-22 ENCOUNTER — Ambulatory Visit: Admit: 2020-03-22 | Payer: PRIVATE HEALTH INSURANCE | Attending: Anesthesiology | Primary: Internal Medicine

## 2020-03-22 ENCOUNTER — Encounter: Admit: 2020-03-22 | Payer: PRIVATE HEALTH INSURANCE | Attending: Gastroenterology | Primary: Internal Medicine

## 2020-03-22 ENCOUNTER — Inpatient Hospital Stay: Admit: 2020-03-22 | Discharge: 2020-03-22 | Payer: MEDICAID | Primary: Internal Medicine

## 2020-03-22 DIAGNOSIS — Z79899 Other long term (current) drug therapy: Secondary | ICD-10-CM

## 2020-03-22 DIAGNOSIS — Z6827 Body mass index (BMI) 27.0-27.9, adult: Secondary | ICD-10-CM

## 2020-03-22 DIAGNOSIS — L905 Scar conditions and fibrosis of skin: Secondary | ICD-10-CM

## 2020-03-22 DIAGNOSIS — T8859XA Other complications of anesthesia, initial encounter: Secondary | ICD-10-CM

## 2020-03-22 DIAGNOSIS — E669 Obesity, unspecified: Secondary | ICD-10-CM

## 2020-03-22 DIAGNOSIS — E7800 Pure hypercholesterolemia, unspecified: Secondary | ICD-10-CM

## 2020-03-22 DIAGNOSIS — Z1211 Encounter for screening for malignant neoplasm of colon: Secondary | ICD-10-CM

## 2020-03-22 DIAGNOSIS — K648 Other hemorrhoids: Secondary | ICD-10-CM

## 2020-03-22 DIAGNOSIS — R200 Anesthesia of skin: Secondary | ICD-10-CM

## 2020-03-22 MED ORDER — SODIUM CHLORIDE 0.9 % (FLUSH) INJECTION SYRINGE
0.9 % | Freq: Three times a day (TID) | INTRAVENOUS | Status: DC
Start: 2020-03-22 — End: 2020-03-22

## 2020-03-22 MED ORDER — SODIUM CHLORIDE 0.9 % (FLUSH) INJECTION SYRINGE
0.9 % | INTRAVENOUS | Status: DC | PRN
Start: 2020-03-22 — End: 2020-03-22

## 2020-03-22 MED ORDER — PROPOFOL 10 MG/ML INTRAVENOUS EMULSION
10 mg/mL | Status: CP
Start: 2020-03-22 — End: ?

## 2020-03-22 MED ORDER — LACTATED RINGERS INTRAVENOUS SOLUTION
INTRAVENOUS | Status: DC
Start: 2020-03-22 — End: 2020-03-22

## 2020-03-22 MED ORDER — LACTATED RINGERS INTRAVENOUS SOLUTION
INTRAVENOUS | Status: DC
Start: 2020-03-22 — End: 2020-03-22
  Administered 2020-03-22: 11:00:00 1000.000 mL/h via INTRAVENOUS

## 2020-03-22 MED ORDER — PROPOFOL 10 MG/ML INTRAVENOUS EMULSION
10 mg/mL | Status: DC | PRN
Start: 2020-03-22 — End: 2020-03-22
  Administered 2020-03-22 (×16): 10 mg/mL via INTRAVENOUS

## 2020-03-22 MED ORDER — LACTATED RINGERS INTRAVENOUS SOLUTION
INTRAVENOUS | Status: DC | PRN
Start: 2020-03-22 — End: 2020-03-22
  Administered 2020-03-22: 11:00:00 via INTRAVENOUS

## 2020-03-22 MED ORDER — SIMETHICONE 40 MG/0.6 ML ORAL DROPS,SUSPENSION
40 mg/0.6 mL | Status: DC | PRN
Start: 2020-03-22 — End: 2020-03-22

## 2020-03-22 MED ORDER — LIDOCAINE (PF) 20 MG/ML (2 %) INTRAVENOUS SOLUTION
20 mg/mL (2 %) | Status: CP
Start: 2020-03-22 — End: ?

## 2020-03-22 MED ORDER — LIDOCAINE (PF) 20 MG/ML (2 %) INJECTION SOLUTION
20 mg/mL (2 %) | Status: DC | PRN
Start: 2020-03-22 — End: 2020-03-22
  Administered 2020-03-22: 12:00:00 20 mg/mL (2 %) via INTRAVENOUS

## 2020-03-22 NOTE — Other
Post Anesthesia Transfer of Care NotePatient: Aaron Irwin) Performed: Procedure(s) (LRB):COLONOSCOPY, FLEXIBLE; DX, W/WO SPECIMENS/COLON DECOMP (SEP PROC) (N/A) Patient location: PACU Last Vitals: Vitals Value Taken Time BP 111/60 03/22/20 0854 Temp 97.4 03/22/20 0854 Pulse 63 03/22/20 0854 Resp 14 03/22/20 0854 SpO2 100 03/22/20 0854 Level of consciousness: sedatedTransport Vital Signs:  Stable since the last set of recorded intra-operative vital signsComplications: noneIntra-operative Intake & Output and Antibiotics as per Anesthesia record and discussed with the RN.

## 2020-03-22 NOTE — Other
Operative Diagnosis:Pre-op:   * No pre-op diagnosis entered * Patient Coded Diagnosis   Pre-op diagnosis: Encounter for screening colonoscopy  Post-op diagnosis: Encounter for screening colonoscopy  Patient Diagnosis   None    Post-op diagnosis:   * Encounter for screening colonoscopy [Z12.11]Operative Procedure(s) :Procedure(s) (LRB):COLONOSCOPY, FLEXIBLE; DX, W/WO SPECIMENS/COLON DECOMP (SEP PROC) (N/A)Post-op Procedure & Diagnosis ConfirmationPost-op Diagnosis: Post-op Diagnosis confirmed (no changes)Post-op Procedure: Post-op Procedure confirmed (no changes)Anesthesia ClarifiersGI/Endoscopy: Planned Screening Colonoscopy -  Normal Colon

## 2020-03-22 NOTE — Discharge Instructions
Your colonoscopy showed: normal exam with no polypsYou will need a follow up colonoscopy in 10 years.PLEASE SCHEDULE AN INTERVAL OFFICE/CLINIC FOLLOW-UP WITH DR. GAIDOS FOR GAS AND BLOATING SYMPTOMS. Do not drive, drink alcohol, operate machinery, make critical decisions for the next 24 hours. Caution with any activity required significant balance.Because air was put into your colon during the procedure, expelling large amounts of air from your rectum is normal. You may not have a bowel movement for 1-3 days because of the colonoscopy preparation. This is normal. Immediately seek urgent medical attention if you notice any of the following: ? Develop chills or temperature (warm to touch), especially if you have a thermometer and your temperature is above 101 ? Difficulty breathing or difficultly swallowing ? Persistent vomiting ? Moderate to severe abdominal pain, other than gas cramps ? Chest pain ? Black, tarry stools ? Any bleeding - exceeding one tablespoon. Call your physician if the site where your intravenous was started becomes red, swollen, painful, or warm to touch.Please call my office anytime if questions, and call in 1 week to review the findings and plan at [phone].

## 2020-03-22 NOTE — H&P
[facility] Digestive Diseases  Gastroenterology History & PhysicalEvaluation requested by: Attending Provider: Burna Cash, MD [phone] History: Aaron Irwin is a 49 y.o. male with history of inguinal hernia surgery on 01/19/20 who presents for first time average risk screening colonoscopy. Pt reports ongoing gas and bloating but denies any abdominal pain, nausea, vomiting, dysphagia, hematochezia, melena, chronic diarrhea, or other GI complaints. No family hx of colorectal cancer. Review of Systems: Gen: No fever, chills, recent illnessCV: No chest painPulm: No shortness of breathGI: See AboveReview of Medical/Family/Surgical/Social History/Medications: Past Medical History: Diagnosis Date ? Anesthesia   long time to wake up from anesthesia s/p hernia procedure ? Anesthesia complication 01/19/2020  Please Review Anesthesia Documentation  ? Hypercholesteremia  ? Scar  No past medical history pertinent negatives. Family History Problem Relation Age of Onset ? Hypertension Mother  ? Diabetes Neg Hx  ? Cancer Neg Hx  ? Heart disease Neg Hx   Past Surgical History: Procedure Laterality Date ? INGUINAL HERNIA REPAIR  01/19/2020 ? KNEE ARTHROCENTESIS   No past surgical history pertinent negatives on file. Social History Tobacco Use ? Smoking status: Never Smoker ? Smokeless tobacco: Never Used Substance Use Topics ? Alcohol use: Yes   Comment: social ? Drug use: No  Outpatient Medications:No current facility-administered medications on file prior to encounter.  Current Outpatient Medications on File Prior to Encounter Medication Sig Dispense Refill ? multivitamin capsule Take 1 capsule by mouth daily.   Objective: Vitals:Vitals:  03/15/20 1114 03/22/20 0707 BP:  132/88 Pulse:  65 Resp:  18 Temp:  97 ?F (36.1 ?C) TempSrc:  Temporal SpO2:  98% Weight: 94.3 kg 94.8 kg Height: 6' 1 (1.854 m) 6' 1 (1.854 m) Physical Exam: Gen: lying in bed in NADHEENT: no scleral icterusPULM: clear to auscultation bilaterally, normal effortCVS: regular rate and rhythm, no murmur appreciatedAbd: soft, nontender, nondistended with normoactive bowel soundsExt: No edemaSkin: No jaundiceNeuro: Alert and conversantLabs:Lab Results Component Value Date  SARSCOV2 Not Detected 03/20/2020 Lab Results Component Value Date  WBC 7.6 01/19/2020  HGB 14.7 01/19/2020  HCT 40.8 01/19/2020  MCV 84.6 01/19/2020  PLT 283 01/19/2020    Chemistry  Lab Results Component Value Date  NA 134 01/19/2020  K  01/19/2020    Comment:    Sample Hemolyzed.   CL 100 01/19/2020  CO2 26 01/19/2020  BUN 8 01/19/2020  CREATININE 0.90 01/19/2020  GLU 178 (H) 01/19/2020  Lab Results Component Value Date  CALCIUM 8.3 (L) 01/19/2020  ALKPHOS 53 01/19/2020  ALKPHOS 53 01/19/2020  AST  01/19/2020    Comment:    Sample Hemolyzed.   AST  01/19/2020    Comment:    Sample hemolyzed.  ALT  01/19/2020    Comment:    Sample hemolyzedCalcium dobesilate can cause artificially low ALT results at therapeutic concentrations  ALT  01/19/2020    Comment:    Sample hemolyzed.Calcium dobesilate can cause artificially low ALT results at therapeutic concentrations  BILITOT 0.5 01/19/2020  BILITOT 0.4 01/19/2020   No results found for: INR, PROTIMENo results found for: IRON, TIBC, FERRITIN Imaging:No results found.Assessment/Plan:  49 y.o. male with history of inguinal hernia surgery on 01/19/20 who presents for first time average risk screening colonoscopy. - Risks and benefits of procedure were discussed and informed consent obtained - Proceed with colonoscopy todayCase discussed with Dr. Ellison Carwin.Signed:Otisha Spickler, MDYale Digestive DiseasesClinical Fellow

## 2020-03-22 NOTE — Anesthesia Pre-Procedure Evaluation
This is a 49 y.o. male scheduled for COLONOSCOPY, FLEXIBLE; DX, W/WO SPECIMENS/COLON DECOMP (SEP PROC) (N/A ).Review of Systems/ Medical HistoryPatient summary, nursing notes, Labs, pre-procedure vitals, height, weight and NPO status reviewed.No previous anesthesia concernsAnesthesia Evaluation:   History of anesthetic complications ( Patient reports he was told he had too much for his weight and should only half of what he recieved for anesthesia 01/19/20)  Estimated body mass index is 27.57 kg/m? as calculated from the following:  Height as of this encounter: 6' 1 (1.854 m).  Weight as of this encounter: 94.8 kg. CC/HPI: CHART PRE POPULATED BASED ON MEDICAL RECORD. INCOMPLETE REVIEW OF SYSTEMS. PATIENT NOT YET EVALUATED49 y/o male PMHx: hypercholesteremia and anesthesia complication 01/19/20 (see chart notes) scheduled for screening colonoscopy. Past Surgical History:  PAH: 01/19/20: PLEASE REVIEW ANESTHESIA RECORDPatient admitted for observation due to increased somnolence pre-operatively after a paravertebral block, overnight course c/b urinary retention with straight cath X 2.Knee ArthrocentesisInguinal Hernia Repair Cardiovascular: Negative   Patient has a history of: hypercholesterolemia. -Exercise tolerance: >4 METS -Vascular Disease:  Negative   Respiratory:  Negative.-Nicotine Dependence: noHEENT: Negative.Neuromuscular: NegativeSkeletal/Skin:  NegativeGastrointestinal/Genitourinary:  Negative -Nutritional Disorders: Patient has has increased body weight- obesity.Hematological/Lymphatic: NegativeEndocrine/Metabolic:  Negative.Behavioral/Psychiatric & Syndromes: NegativePhysical ExamCardiovascular:  Rhythm: regularPulmonary:  Patient's breath sounds clear to auscultationAirway:  Mallampati: IITM distance: >3 FBAnesthesia PlanASA 2 The primary anesthesia plan is  general. Anesthesia informed consent obtained. Consent obtained from: patientUse of blood products: consented  Plan discussed with CRNA.Anesthesiologist's Pre Op NoteI personally evaluated and examined the patient prior to the intra-operative phase of care.

## 2020-03-22 NOTE — Anesthesia Post-Procedure Evaluation
Anesthesia Post-op NotePatient: Aaron Buff GarciaProcedure(s):  Procedure(s) (LRB):COLONOSCOPY, FLEXIBLE; DX, W/WO SPECIMENS/COLON DECOMP (SEP PROC) (N/A) Patient location: PACULast Vitals:  I have noted the vital signs as listed in the nursing notes.Mental status recovered: patient participates in evaluation: YesVital signs reviewed: YesRespiratory function stable:YesAirway is patent: YesCardiovascular function and hydration status stable: YesPain control satisfactory: YesNausea and vomiting control satisfactory:Yes

## 2020-03-22 NOTE — Other
Colonoscopy performedNo specimens obtained

## 2020-03-23 ENCOUNTER — Ambulatory Visit: Admit: 2020-03-23 | Payer: PRIVATE HEALTH INSURANCE | Attending: Gastroenterology | Primary: Internal Medicine

## 2020-03-29 ENCOUNTER — Ambulatory Visit
Admit: 2020-03-29 | Payer: PRIVATE HEALTH INSURANCE | Attending: Rehabilitative and Restorative Service Providers" | Primary: Internal Medicine

## 2020-04-05 ENCOUNTER — Ambulatory Visit
Admit: 2020-04-05 | Payer: PRIVATE HEALTH INSURANCE | Attending: Rehabilitative and Restorative Service Providers" | Primary: Internal Medicine

## 2020-04-13 ENCOUNTER — Encounter: Admit: 2020-04-13 | Payer: MEDICAID | Primary: Internal Medicine

## 2020-06-27 ENCOUNTER — Encounter: Admit: 2020-06-27 | Payer: PRIVATE HEALTH INSURANCE | Attending: Dermatology | Primary: Internal Medicine

## 2020-08-10 ENCOUNTER — Encounter: Admit: 2020-08-10 | Payer: MEDICAID | Primary: Internal Medicine

## 2020-09-11 ENCOUNTER — Inpatient Hospital Stay: Admit: 2020-09-11 | Discharge: 2020-09-11 | Payer: MEDICAID | Primary: Internal Medicine

## 2020-09-11 DIAGNOSIS — Z20828 Contact with and (suspected) exposure to other viral communicable diseases: Secondary | ICD-10-CM

## 2020-09-11 DIAGNOSIS — Z20822 Contact with and (suspected) exposure to covid-19: Secondary | ICD-10-CM

## 2020-09-11 LAB — COVID-19 CLEARANCE OR FOR PLACEMENT ONLY: BKR SARS-COV-2 RNA (COVID-19) (YH): NOT DETECTED

## 2020-09-25 ENCOUNTER — Encounter: Admit: 2020-09-25 | Payer: PRIVATE HEALTH INSURANCE | Attending: Hematology & Oncology | Primary: Internal Medicine

## 2020-09-25 DIAGNOSIS — M25561 Pain in right knee: Secondary | ICD-10-CM

## 2020-09-30 ENCOUNTER — Inpatient Hospital Stay: Admit: 2020-09-30 | Discharge: 2020-09-30 | Payer: MEDICAID | Primary: Internal Medicine

## 2020-09-30 DIAGNOSIS — M25561 Pain in right knee: Secondary | ICD-10-CM

## 2020-10-13 ENCOUNTER — Ambulatory Visit: Admit: 2020-10-13 | Payer: MEDICAID | Primary: Internal Medicine

## 2020-10-13 ENCOUNTER — Inpatient Hospital Stay: Admit: 2020-10-13 | Discharge: 2020-10-13 | Payer: MEDICAID | Primary: Internal Medicine

## 2020-10-13 DIAGNOSIS — Z20822 Contact with and (suspected) exposure to covid-19: Secondary | ICD-10-CM

## 2020-10-13 DIAGNOSIS — Z20828 Contact with and (suspected) exposure to other viral communicable diseases: Secondary | ICD-10-CM

## 2020-10-15 LAB — SARS COV-2 (COVID-19) RNA-~~LOC~~ LABS (BH GH LMW YH): BKR SARS-COV-2 RNA (COVID-19) (YH): NEGATIVE

## 2020-11-07 ENCOUNTER — Encounter: Admit: 2020-11-07 | Payer: PRIVATE HEALTH INSURANCE | Attending: Dermatology | Primary: Internal Medicine

## 2020-11-16 ENCOUNTER — Encounter: Admit: 2020-11-16 | Payer: PRIVATE HEALTH INSURANCE | Attending: Adult Health | Primary: Internal Medicine

## 2020-11-16 DIAGNOSIS — Z01818 Encounter for other preprocedural examination: Secondary | ICD-10-CM

## 2020-11-17 ENCOUNTER — Encounter: Admit: 2020-11-17 | Payer: PRIVATE HEALTH INSURANCE | Attending: Hematology & Oncology | Primary: Internal Medicine

## 2020-11-17 DIAGNOSIS — E7800 Pure hypercholesterolemia, unspecified: Secondary | ICD-10-CM

## 2020-11-17 DIAGNOSIS — R200 Anesthesia of skin: Secondary | ICD-10-CM

## 2020-11-17 DIAGNOSIS — T8859XA Other complications of anesthesia, initial encounter: Secondary | ICD-10-CM

## 2020-11-17 DIAGNOSIS — L905 Scar conditions and fibrosis of skin: Secondary | ICD-10-CM

## 2020-11-17 MED ORDER — LISINOPRIL 10 MG TABLET
10 mg | Freq: Every day | ORAL | Status: AC
Start: 2020-11-17 — End: 2022-05-20

## 2020-11-20 ENCOUNTER — Inpatient Hospital Stay: Admit: 2020-11-20 | Discharge: 2020-11-20 | Payer: MEDICAID | Primary: Internal Medicine

## 2020-11-20 DIAGNOSIS — Z01818 Encounter for other preprocedural examination: Secondary | ICD-10-CM

## 2020-11-20 DIAGNOSIS — Z20822 Contact with and (suspected) exposure to covid-19: Secondary | ICD-10-CM

## 2020-11-20 DIAGNOSIS — Z01812 Encounter for preprocedural laboratory examination: Secondary | ICD-10-CM

## 2020-11-20 LAB — COVID-19 CLEARANCE OR FOR PLACEMENT ONLY: BKR SARS-COV-2 RNA (COVID-19) (YH): NEGATIVE

## 2020-11-22 ENCOUNTER — Ambulatory Visit: Admit: 2020-11-22 | Payer: PRIVATE HEALTH INSURANCE | Attending: Anesthesiology | Primary: Internal Medicine

## 2020-11-22 DIAGNOSIS — S83203A Other tear of unspecified meniscus, current injury, right knee, initial encounter: Secondary | ICD-10-CM

## 2020-11-22 DIAGNOSIS — M25561 Pain in right knee: Secondary | ICD-10-CM

## 2020-11-23 ENCOUNTER — Ambulatory Visit: Admit: 2020-11-23 | Payer: PRIVATE HEALTH INSURANCE | Attending: Anesthesiology | Primary: Internal Medicine

## 2020-11-23 ENCOUNTER — Inpatient Hospital Stay: Admit: 2020-11-23 | Discharge: 2020-11-23 | Payer: MEDICAID | Primary: Internal Medicine

## 2020-11-23 ENCOUNTER — Encounter: Admit: 2020-11-23 | Payer: PRIVATE HEALTH INSURANCE | Attending: Hematology & Oncology | Primary: Internal Medicine

## 2020-11-23 DIAGNOSIS — L905 Scar conditions and fibrosis of skin: Secondary | ICD-10-CM

## 2020-11-23 DIAGNOSIS — E7800 Pure hypercholesterolemia, unspecified: Secondary | ICD-10-CM

## 2020-11-23 DIAGNOSIS — Z79899 Other long term (current) drug therapy: Secondary | ICD-10-CM

## 2020-11-23 DIAGNOSIS — E785 Hyperlipidemia, unspecified: Secondary | ICD-10-CM

## 2020-11-23 DIAGNOSIS — R200 Anesthesia of skin: Secondary | ICD-10-CM

## 2020-11-23 DIAGNOSIS — I10 Essential (primary) hypertension: Secondary | ICD-10-CM

## 2020-11-23 DIAGNOSIS — M23203 Derangement of unspecified medial meniscus due to old tear or injury, right knee: Secondary | ICD-10-CM

## 2020-11-23 DIAGNOSIS — S83203A Other tear of unspecified meniscus, current injury, right knee, initial encounter: Secondary | ICD-10-CM

## 2020-11-23 DIAGNOSIS — M25561 Pain in right knee: Secondary | ICD-10-CM

## 2020-11-23 DIAGNOSIS — T8859XA Other complications of anesthesia, initial encounter: Secondary | ICD-10-CM

## 2020-11-23 MED ORDER — LIDOCAINE-EPINEPHRINE 0.5 %-1:200,000 INJECTION SOLUTION
0.5 %-1:200,000 | Status: DC | PRN
Start: 2020-11-23 — End: 2020-11-23
  Administered 2020-11-23: 13:00:00 0.5 %-1:200,000

## 2020-11-23 MED ORDER — FENTANYL (PF) 50 MCG/ML INJECTION SOLUTION
50 mcg/mL | Status: CP
Start: 2020-11-23 — End: ?

## 2020-11-23 MED ORDER — ACETAMINOPHEN 1,000 MG/100 ML (10 MG/ML) INTRAVENOUS SOLUTION
10 mg/mL | INTRAVENOUS | Status: DC | PRN
Start: 2020-11-23 — End: 2020-11-23
  Administered 2020-11-23: 13:00:00 10 mg/mL via INTRAVENOUS

## 2020-11-23 MED ORDER — ONDANSETRON HCL (PF) 4 MG/2 ML INJECTION SOLUTION
42 mg/2 mL | INTRAVENOUS | Status: DC | PRN
Start: 2020-11-23 — End: 2020-11-23

## 2020-11-23 MED ORDER — FENTANYL (PF) 50 MCG/ML INJECTION SOLUTION
50 mcg/mL | INTRAVENOUS | Status: DC | PRN
Start: 2020-11-23 — End: 2020-11-23

## 2020-11-23 MED ORDER — DIMENHYDRINATE 5 MG/ML IN 0.9% SODIUM CHLORIDE
INTRAVENOUS | Status: DC | PRN
Start: 2020-11-23 — End: 2020-11-23

## 2020-11-23 MED ORDER — HYDROMORPHONE 0.5 MG/0.5 ML INJECTION SYRINGE
0.50.5 mg/ mL | INTRAVENOUS | Status: DC | PRN
Start: 2020-11-23 — End: 2020-11-23

## 2020-11-23 MED ORDER — PHENYLEPHRINE 1 MG/10 ML (100 MCG/ML) IN 0.9 % SOD.CHLORIDE IV SYRINGE
1 mg/0 mL (00 mcg/mL) | INTRAVENOUS | Status: DC | PRN
Start: 2020-11-23 — End: 2020-11-23
  Administered 2020-11-23: 13:00:00 1 mg/0 mL (00 mcg/mL) via INTRAVENOUS

## 2020-11-23 MED ORDER — HYDROMORPHONE (PF) 0.2 MG/ML INJECTION SYRINGE
0.2 mg/mL | INTRAVENOUS | Status: DC | PRN
Start: 2020-11-23 — End: 2020-11-23

## 2020-11-23 MED ORDER — SODIUM CHLORIDE 0.9 % (FLUSH) INJECTION SYRINGE
0.9 % | INTRAVENOUS | Status: DC | PRN
Start: 2020-11-23 — End: 2020-11-23

## 2020-11-23 MED ORDER — MIDAZOLAM (PF) 1 MG/ML INJECTION SOLUTION
1 mg/mL | INTRAVENOUS | Status: DC | PRN
Start: 2020-11-23 — End: 2020-11-23
  Administered 2020-11-23: 13:00:00 1 mg/mL via INTRAVENOUS

## 2020-11-23 MED ORDER — EPI 1MG/ML (1ML)/3000ML LACTATED RINGERS SOLUTION IRRIGATION (MIXTURE)
Status: DC | PRN
Start: 2020-11-23 — End: 2020-11-23
  Administered 2020-11-23: 13:00:00 3001.000 mL

## 2020-11-23 MED ORDER — ROPIVACAINE (PF) 2 MG/ML (0.2 %) INJECTION SOLUTION
2 mg/mL (0. %) | Status: CP | PRN
Start: 2020-11-23 — End: ?
  Administered 2020-11-23: 14:00:00 2 mL/h

## 2020-11-23 MED ORDER — OXYCODONE IMMEDIATE RELEASE 5 MG TABLET
5 mg | ORAL | Status: DC | PRN
Start: 2020-11-23 — End: 2020-11-23

## 2020-11-23 MED ORDER — LACTATED RINGERS IRRIGATION SOLUTION
Status: DC | PRN
Start: 2020-11-23 — End: 2020-11-23
  Administered 2020-11-23 (×3)

## 2020-11-23 MED ORDER — PROPOFOL 10 MG/ML INTRAVENOUS EMULSION
10 mg/mL | Status: CP
Start: 2020-11-23 — End: ?

## 2020-11-23 MED ORDER — MIDAZOLAM 1 MG/ML INJECTION SOLUTION
1 mg/mL | Status: CP
Start: 2020-11-23 — End: ?

## 2020-11-23 MED ORDER — PROPOFOL 10 MG/ML INTRAVENOUS EMULSION
10 mg/mL | INTRAVENOUS | Status: DC | PRN
Start: 2020-11-23 — End: 2020-11-23
  Administered 2020-11-23: 13:00:00 10 mg/mL via INTRAVENOUS

## 2020-11-23 MED ORDER — MORPHINE (PF) 1 MG/ML INJECTION SOLUTION
1 mg/mL | Status: DC
Start: 2020-11-23 — End: 2020-11-23

## 2020-11-23 MED ORDER — FENTANYL (PF) 50 MCG/ML INJECTION SOLUTION
50 mcg/mL | INTRAVENOUS | Status: DC | PRN
Start: 2020-11-23 — End: 2020-11-23
  Administered 2020-11-23 (×3): 50 mcg/mL via INTRAVENOUS

## 2020-11-23 MED ORDER — PROPOFOL 10 MG/ML INTRAVENOUS EMULSION
10 mg/mL | INTRAVENOUS | Status: DC | PRN
Start: 2020-11-23 — End: 2020-11-23
  Administered 2020-11-23: 13:00:00 10 mL/h via INTRAVENOUS

## 2020-11-23 MED ORDER — SODIUM CHLORIDE 0.9 % (FLUSH) INJECTION SYRINGE
0.9 % | Freq: Three times a day (TID) | INTRAVENOUS | Status: DC
Start: 2020-11-23 — End: 2020-11-23

## 2020-11-23 MED ORDER — LACTATED RINGERS INTRAVENOUS SOLUTION
INTRAVENOUS | Status: DC
Start: 2020-11-23 — End: 2020-11-23

## 2020-11-23 MED ORDER — MORPHINE (PF) 1 MG/ML INJECTION SOLUTION
1 mg/mL | Status: DC | PRN
Start: 2020-11-23 — End: 2020-11-23
  Administered 2020-11-23: 14:00:00 1 mg/mL via INTRATHECAL

## 2020-11-23 MED ORDER — LACTATED RINGERS INTRAVENOUS SOLUTION
INTRAVENOUS | Status: DC | PRN
Start: 2020-11-23 — End: 2020-11-23
  Administered 2020-11-23: 13:00:00 via INTRAVENOUS

## 2020-11-23 MED ORDER — METHYLPREDNISOLONE ACETATE 40 MG/ML SUSPENSION FOR INJECTION
40 mg/mL | Status: DC | PRN
Start: 2020-11-23 — End: 2020-11-23
  Administered 2020-11-23: 14:00:00 40 mg/mL via INTRAMUSCULAR

## 2020-11-23 MED ORDER — CEFAZOLIN 1 GRAM SOLUTION FOR INJECTION
1 gram | INTRAVENOUS | Status: DC | PRN
Start: 2020-11-23 — End: 2020-11-23
  Administered 2020-11-23: 13:00:00 1 gram via INTRAVENOUS

## 2020-11-23 NOTE — Brief Op Note
[facility] HealthPatient Name: Aaron Irwin        [id number] Patient DOB: 1971/10/21     Surgery Date: 2/3/2022Surgeon(s) and Role:   * Kayal Mula, Reinaldo Raddle., MD - PrimaryAssistant(s):* No surgical staff found *Staff:  Circulator: Idalia Needle, RN; Derrel Nip, RNRelief Circulator: Marcello Fennel, RNScrub Person: Reed, DanniellePre-Op Diagnosis: Right knee pain, unspecified chronicity [M25.561]Peripheral tear of meniscus of right knee as current injury, initial encounter [S83.203A] Procedure(s) and Anesthesia Type:   * RIGHT KNEE ARTHROSCOPY, PARTIAL MEDIAL MENISECTOMY, MICROFRACTURE - GENERALOperative Findings badly torn medial and frayed lateral menisci, moderate medial and mild lateral and PF degenerative changes, anterior 1 to 1 1/2 cm articular femoral condylar full thickness articular lossSigns of infection present at the time of surgery at the operative site: no Blood and Blood Products: none                 Drains:  noneEBL: <25 cc       Post Operative Diagnosis: * No post-op diagnosis entered * Aaron Irwin, MD2/3/20229:40 AM

## 2020-11-23 NOTE — Anesthesia Pre-Procedure Evaluation
This is a 50 y.o. male scheduled for RIGHT KNEE ARTHROSCOPY (Right ).Review of Systems/ Medical HistoryPatient summary, nursing notes, EKG/Cardiac Studies , Labs, pre-procedure vitals, height, weight and NPO status reviewed.Anesthesia Evaluation:   History of anesthetic complications (Episode of LAST after PNB)  Estimated body mass index is 27.71 kg/m? as calculated from the following:  Height as of this encounter: 6' 1 (1.854 m).  Weight as of this encounter: 95.3 kg. CC/HPI: 50yo male with PMHx of HLD, presents today for right knee surgery.Cardiovascular:Patient has a history of: hypercholesterolemia. Physical ExamCardiovascular:    Rhythm: regularPulmonary:  normal exam  Airway:  Mallampati: IIITM distance: >3 FBNeck ROM: fullDental:  normal exam  Anesthesia PlanASA 2 The primary anesthesia plan is  general LMA. Standard ASA monitorsAnesthesia plan and risks explained to the patient in detail. All questions answered. Risks include but are not limited to awareness under anesthesia, bronchial aspiration, conversion to general endotracheal anesthesia, sore throat, PONV, risk of injury to lips/teeth/mouth/throat.  Patient expresses understanding and verbalizes agreement with the above mentioned plan.Anesthesia informed consent obtained. Consent obtained from: patientPlan discussed with CRNA.Anesthesiologist's Pre Op NoteI personally evaluated and examined the patient prior to the intra-operative phase of care.

## 2020-11-23 NOTE — Anesthesia Post-Procedure Evaluation
Anesthesia Post-op NotePatient: Aaron Buff GarciaProcedure(s):  Procedure(s) (LRB):RIGHT KNEE ARTHROSCOPY, PARTIAL MEDIAL MENISECTOMY, MICROFRACTURE (Right) Patient location: PACULast Vitals:  I have noted the vital signs as listed in the nursing notes.Mental status recovered: patient participates in evaluation: YesVital signs reviewed: YesRespiratory function stable:YesAirway is patent: YesCardiovascular function and hydration status stable: YesPain control satisfactory: YesNausea and vomiting control satisfactory:YesNo complications documented.

## 2020-11-23 NOTE — Other
Post Anesthesia Transfer of Care NotePatient: Aaron Irwin) Performed: Procedure(s) (LRB):RIGHT KNEE ARTHROSCOPY, PARTIAL MEDIAL MENISECTOMY, MICROFRACTURE (Right) Patient location: PACU Last Vitals: Vitals Value Taken Time BP 118/66 11/23/20 0928 Temp 36.6 ?C 11/23/20 0928 Pulse 55 11/23/20 0929 Resp 8 11/23/20 0929 SpO2 100 % 11/23/20 0929 Vitals shown include unvalidated device data.Level of consciousness: awakeTransport Vital Signs:  Stable since the last set of recorded intra-operative vital signsIntra-operative Complications: noneIntra-operative Intake & Output and Antibiotics as per Anesthesia record and discussed with the RN.

## 2020-11-23 NOTE — Other
Operative Diagnosis:Pre-op:   Right knee pain, unspecified chronicity [M25.561]Peripheral tear of meniscus of right knee as current injury, initial encounter [S83.203A] Patient Coded Diagnosis   Pre-op diagnosis: Right knee pain, unspecified chronicity, Peripheral tear of meniscus of right knee as current injury, initial encounter  Post-op diagnosis: Right knee pain, unspecified chronicity, Peripheral tear of meniscus of right knee as current injury, initial encounter  Patient Diagnosis   Pre-op diagnosis: Right knee pain, unspecified chronicity [M25.561], Peripheral tear of meniscus of right knee as current injury, initial encounter [S83.203A]  Post-op diagnosis:     Post-op diagnosis:   * Right knee pain, unspecified chronicity [M25.561]   * Peripheral tear of meniscus of right knee as current injury, initial encounter [S83.203A]Operative Procedure(s) :Procedure(s) (LRB):RIGHT KNEE ARTHROSCOPY, PARTIAL MEDIAL MENISECTOMY, MICROFRACTURE (Right)Post-op Procedure & Diagnosis ConfirmationPost-op Diagnosis: Post-op Diagnosis confirmed (no changes)Post-op Procedure: Post-op Procedure confirmed (no changes)

## 2020-11-23 NOTE — Other
Per Dr Gwynneth Macleod added discharge instructions: 1. Weight bearing as tolerated. 2. Use crutches  3. Call office to make a follow up appointment for either Monday 2/7 or Tuesday 2/8 4. Elevate right leg 5. Take Aspirin 325 mg daily until seen by Dr Gwynneth Macleod.These instructions were read back to Dr Gwynneth Macleod and confirmed. These instructions were also written on AVS form.A handwritten prescription for Keflex and Vicodin given to patient.

## 2020-11-23 NOTE — Interval H&P Note
Subsequent to admission for surgery or invasive procedure, I have reassessed the patient by examination and review of relevant data pertaining to the planned procedure.He has had no new medical problems since pre operative evaluation by PCP. Electronically Signed by Reinaldo Raddle. Gwynneth Macleod, MD, November 23, 2020

## 2020-11-26 NOTE — Other
[facility]OSPITALOPERATIVE REPORTCONFIDENTIAL - DO NOT COPY WITHOUT APPROPRIATE AUTHORIZATIONNameZACCHEUS, EDMISTER  Service:  [facility] PERIOPUnit No:  [id number] CSN:  [id number] Service Area:  OrtDate of Birth:  [birth year]Date of Adm:  02/03/2022Dictated:  11/23/2020 09:37:29          Dictated by:  Reinaldo Raddle. Jazira Maloney, M.D.Transcribed:  11/23/2020 10:04:48       mmodl/[id number] DATE OF PROCEDURE/SURGERY:  2-3-22OPERATION:Right knee diagnostic arthroscopy, partial medial meniscectomy, microfracture arthroplasty anterior femoral trochlea.OPERATOR:Ihor Meinzer P. Keyonda Bickle, M.D.ASSISTANT:ANESTHESIOLOGIST:ANESTHESIA:General.PREOP DIAGNOSIS:Pain and instability.POSTOP DIAGNOSIS:Pain and instability, medial meniscal tear, degenerative changes primarily medial compartment, and anterior femoral trochlear full-thickness articular lesion.ESTIMATED BLOOD LOSS:Less than 25 cc.SPECIMENS REMOVED:N/AINDICATIONS:  Pain and instability with meniscal tears and degenerative changes.PROCEDURE:  Under general anesthesia, right lower extremity was prepared with DuraPrep and the limb draped free.  Cephalosporin was given intravenously.  Then, the knee was infiltrated with xylocaine with epinephrine to minimize bleeding.  The tourniquet was applied, but not used.  Arthroscope was inserted inferolaterally and instruments inferomedially and superomedially.  The patella had mild degenerative changes beneath its medial facet and to a much lesser extent middle third.  There were no areas of full-thickness loss and only slight fraying in the patellar  degenerated areas.  The lateral compartment had only mild degenerative changes on the tibial side.  There was some fraying of the inner edge of the lateral meniscus, not in need of debridement.  The posterior bundle ACL was intact.  There may be some loss of anterior bundle fibers.  Medial compartment had moderate degenerative changes at the end of the medial femoral condyle with no areas down to bone and the 1-1.5 cm area of full articular loss just medially in the upper femoral trochlea.  The medial meniscus was torn over its posterior two-thirds in a complex fashion.  Several of the fragments were already missing.  There were some loose fragments posteriorly and these were debrided back with a shaver.  The trochlear lesion was micro fractured with Linvatec picks.  Knee was thoroughly irrigated.  Patches of hypertrophic synovium were removed with a shaver.  Skin was closed with nylon.  Xeroform, dry sterile dressing, sterile cast padding, more cast padding, and Ace bandage were applied.  Blood loss no more than 25 cc.  He tolerated the anesthesia and procedure without difficulty and was returned to the recovery room in satisfactory condition.

## 2021-01-17 ENCOUNTER — Inpatient Hospital Stay: Admit: 2021-01-17 | Discharge: 2021-01-17 | Payer: MEDICAID | Primary: Internal Medicine

## 2021-01-17 DIAGNOSIS — U071 COVID-19: Secondary | ICD-10-CM

## 2021-01-17 DIAGNOSIS — Z20828 Contact with and (suspected) exposure to other viral communicable diseases: Secondary | ICD-10-CM

## 2021-01-18 LAB — SARS COV-2 (COVID-19) RNA-~~LOC~~ LABS (BH GH LMW YH): BKR SARS-COV-2 RNA (COVID-19) (YH): DETECTED — AB

## 2021-01-18 NOTE — Other
Outreach call to [place] to inform of covid test result and possible [place] eligibility. Left message on voicemail to please contact care coordinator. Callback number left. Results viewed on mychart.

## 2021-03-07 IMAGING — US US ABDOMEN COMPLETE
1 series · 13 of 25 positions shown · non-contrast
Comparison: 09/05/2018.

HISTORY: 49-year-old male with epigastric pain.
TECHNIQUE: Using real-time and Doppler ultrasound, the abdomen was evaluated.

[Series 1: us abdomen complete · 13 of 75 slices shown]
[im 1/75]
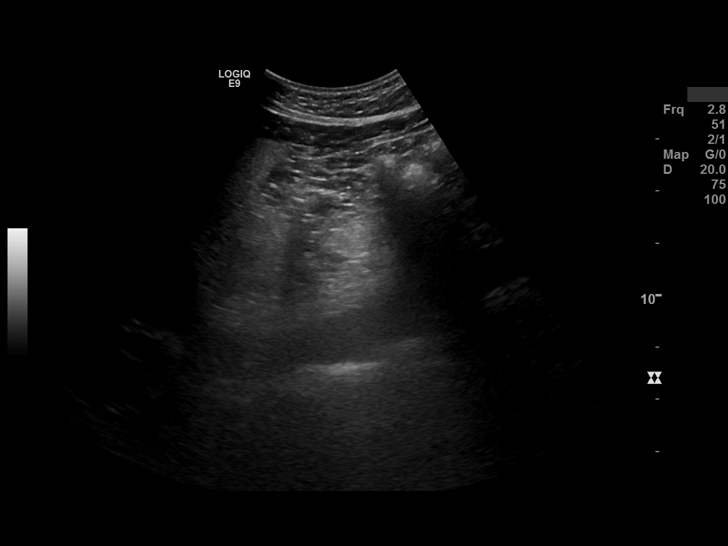
[im 7/75]
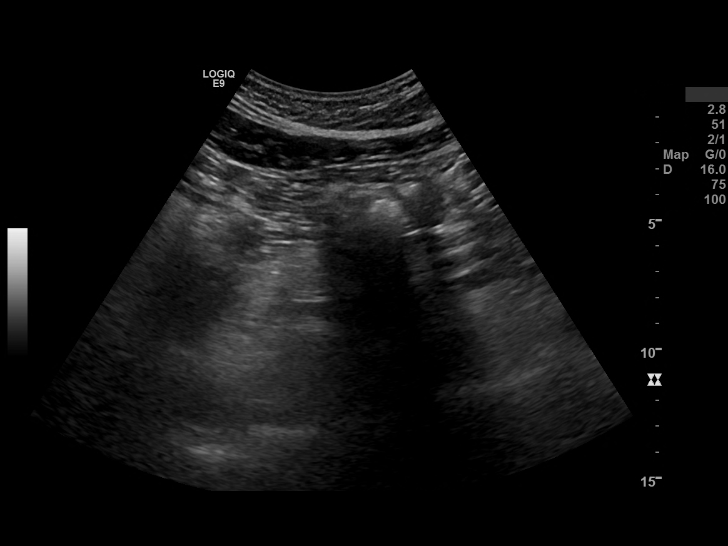
[im 13/75]
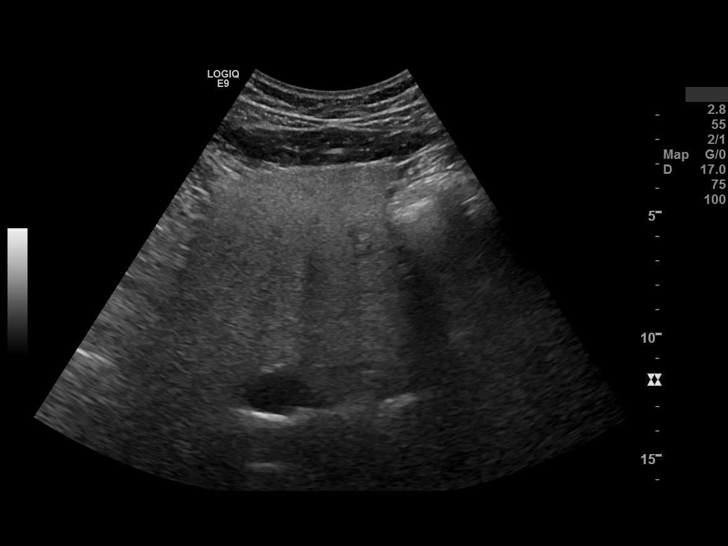
[im 19/75]
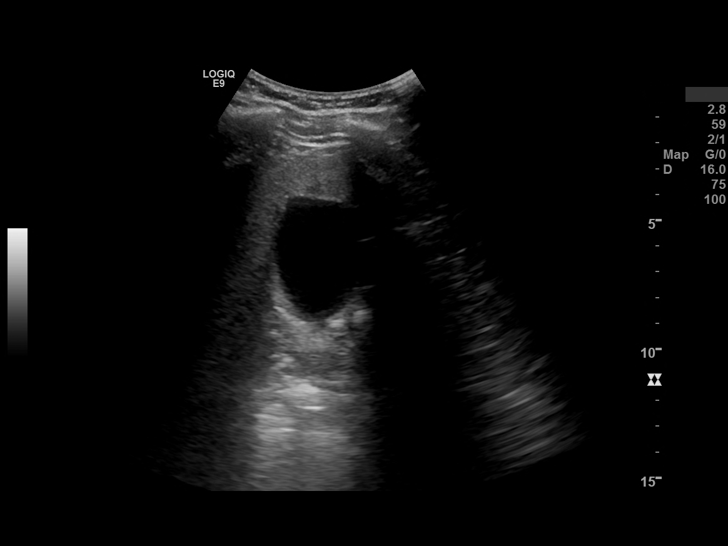
[im 25/75]
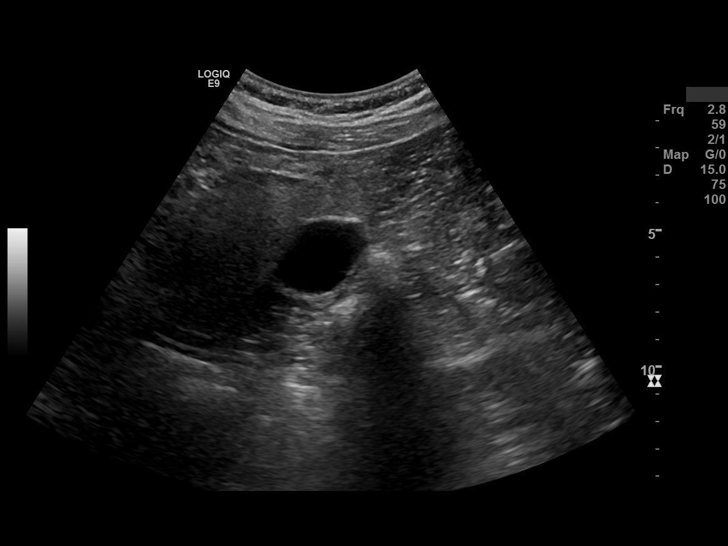
[im 31/75]
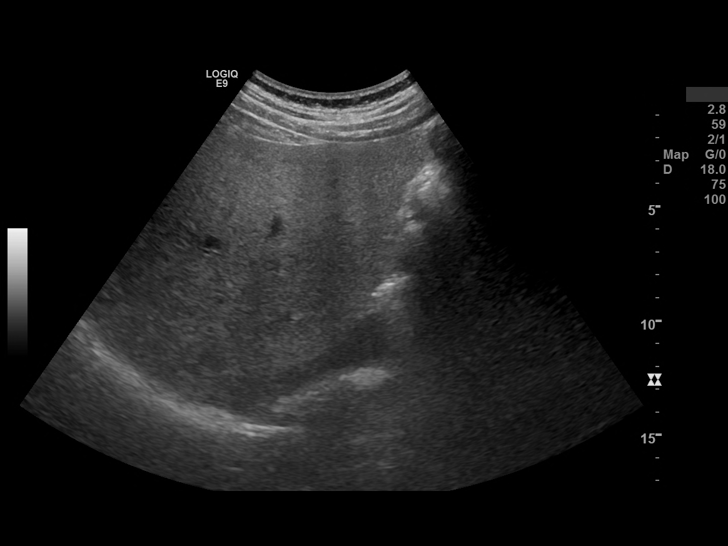
[im 38/75]
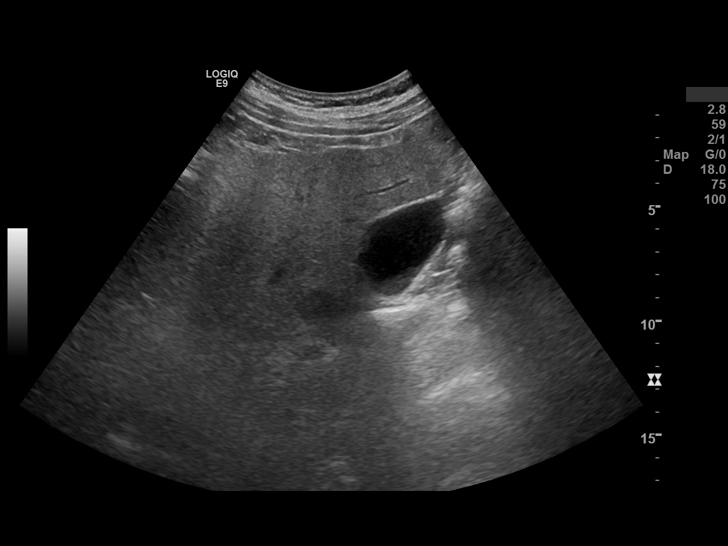
[im 44/75]
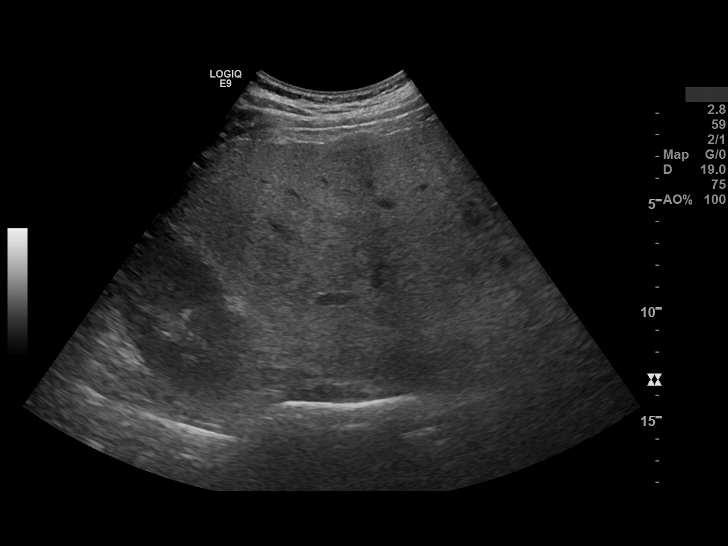
[im 50/75]
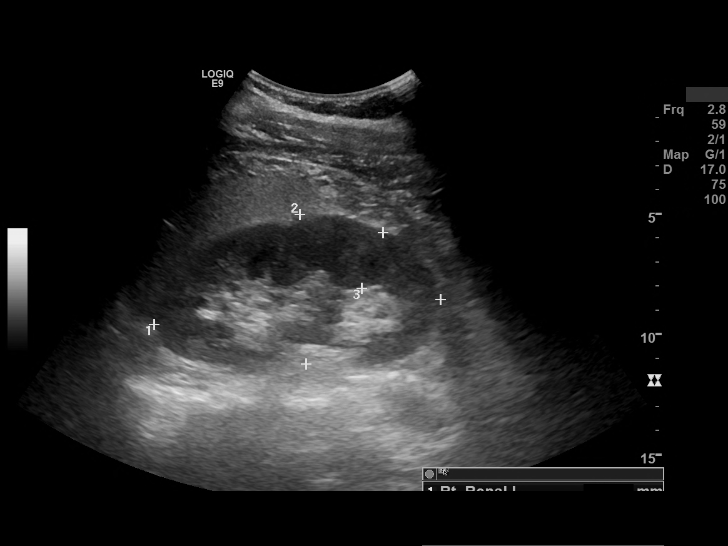
[im 56/75]
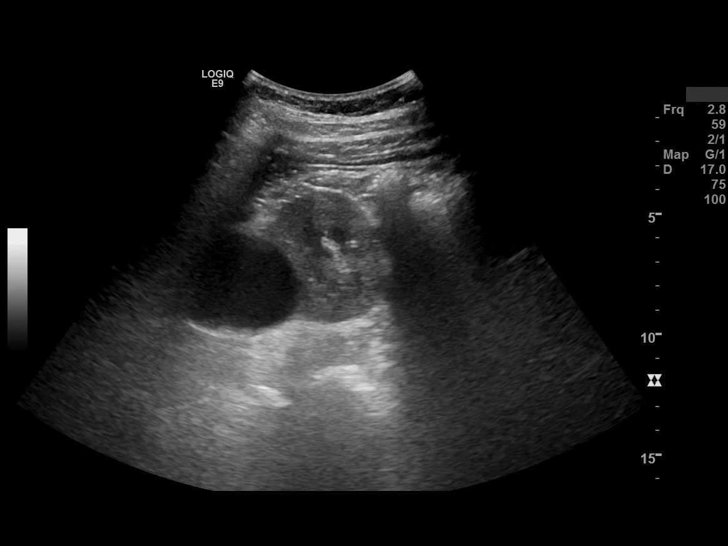
[im 62/75]
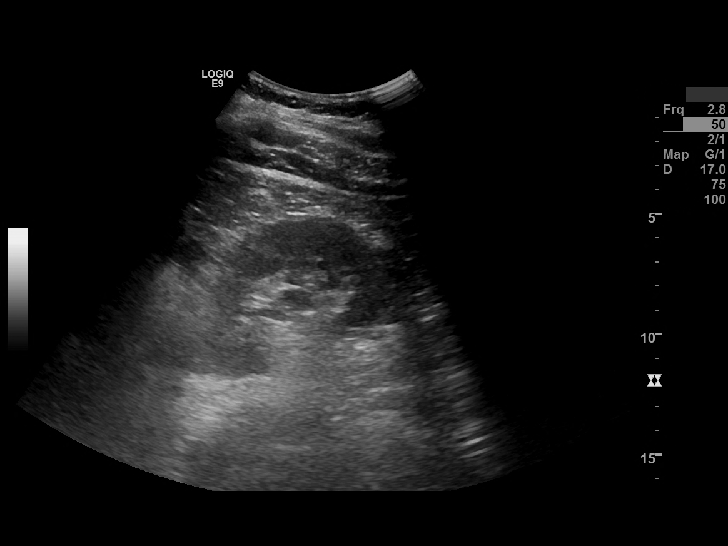
[im 68/75]
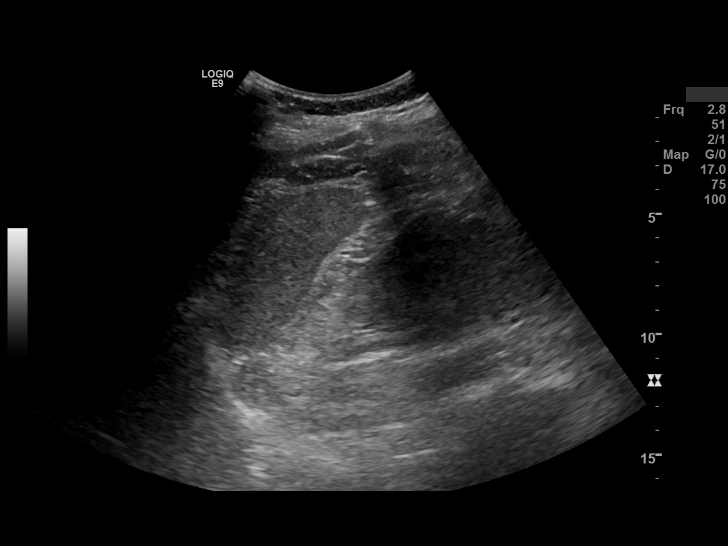
[im 75/75]
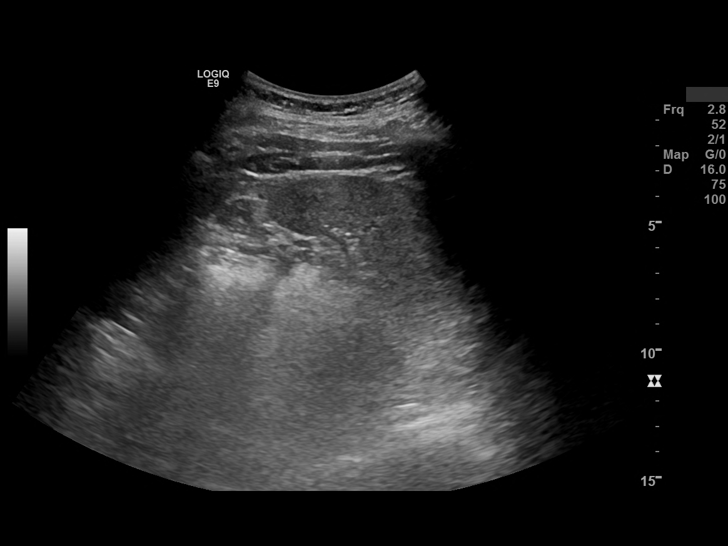

[13 of 25 positions shown; findings below may reference images not displayed]

FINDINGS: The visualized portion of the pancreas is unremarkable. The visualized portion of the abdominal aorta and inferior vena cava is unremarkable.

Liver: Length of 160 mm in the right midclavicular line. The liver echotexture remains very hyperechoic with Rajabgading Maliansal ratio of 2.5. Elastography recommended for further evaluation. Some very subtle microlobulations raise suspicion for early cirrhosis change. 

Hepatopetal flow is identified in the portal vein. No focal masses identified. No intrahepatic biliary ductal dilatation is seen. 

Gallbladder: The gallbladder is within normal limits. The gallbladder wall thickness measures 1.8 mm. There is a negative sonographic Murphy's sign. The common bile duct measures 3.72 mm.

Spleen measures 99 x 37 x 48 mm and demonstrates normal echotexture without focal lesions. Right kidney measures 119 x 62 x 50 mm. The right kidney volume measures 194.8 ml.

Left kidney measures 118 x 55 x 52 mm. The left kidney volume measures 176.7 ml. The kidneys look the same with a right lower pole Bosniak I cyst, slightly larger than before, now 41 mm. No ascites is seen.
IMPRESSION: Echogenic liver with suspicion for early cirrhosis. Elastography recommended.

## 2021-05-18 IMAGING — MR MRI SHOULDER RT WO CONTRAST
5 of 6 series · 34 of 40 positions shown · IV contrast (gadolinium)
Comparison: None

HISTORY: Pain in right shoulder
TECHNIQUE: Multiplanar and multisequence MR imaging of the right shoulder was performed without the administration of intravenous gadolinium.

[Series 3: t2_axial_fs · axial · 4.0mm · 0.59mm/px · z∈[-52,+40]mm · 7 of 22 slices shown]
[im 1/22]
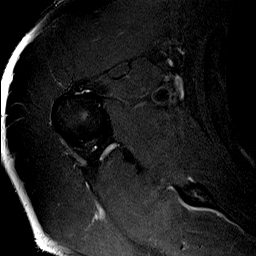
[im 4/22]
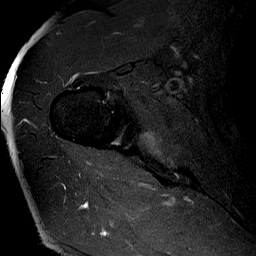
[im 8/22]
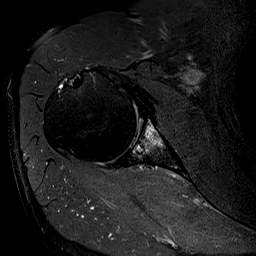
[im 11/22]
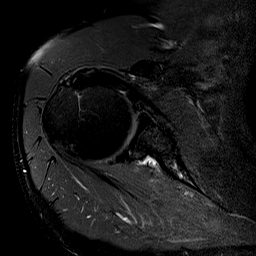
[im 15/22]
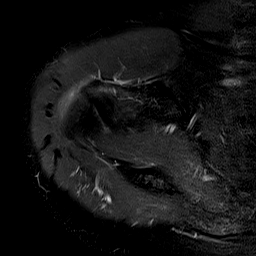
[im 18/22]
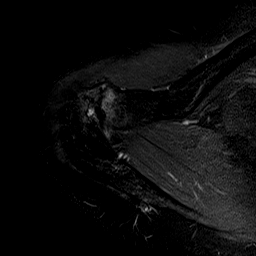
[im 22/22]
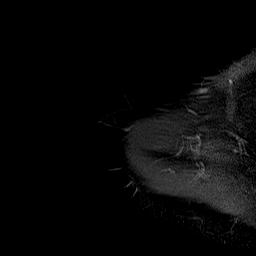

[Series 4: t2_cor_obl_fs · oblique · 4.0mm · 0.61mm/px · 6 of 21 slices shown]
[im 1/21]
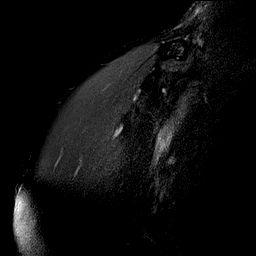
[im 5/21]
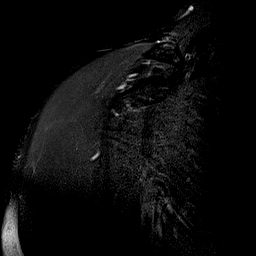
[im 9/21]
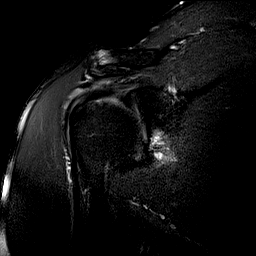
[im 13/21]
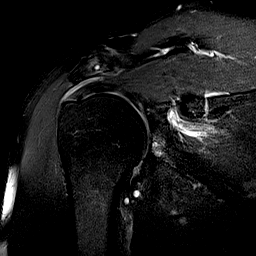
[im 17/21]
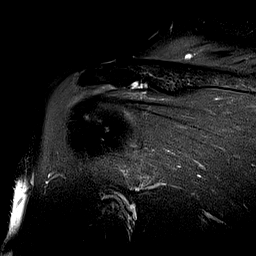
[im 21/21]
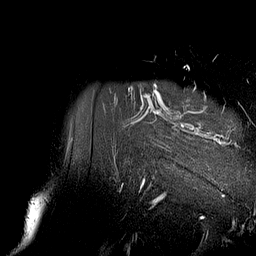

[Series 5: t2_sag_obl_fs · oblique · 4.0mm · 0.61mm/px · 7 of 24 slices shown]
[im 1/24]
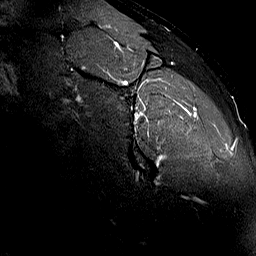
[im 4/24]
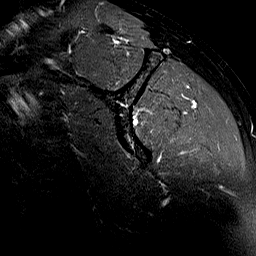
[im 8/24]
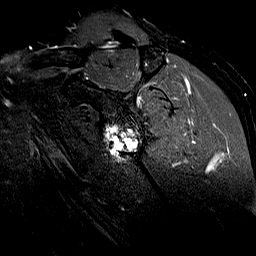
[im 12/24]
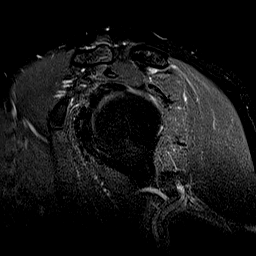
[im 16/24]
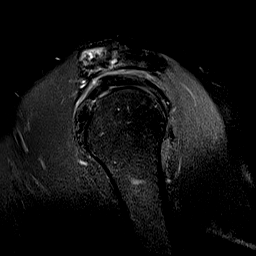
[im 20/24]
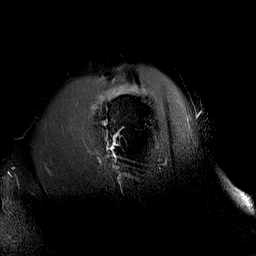
[im 24/24]
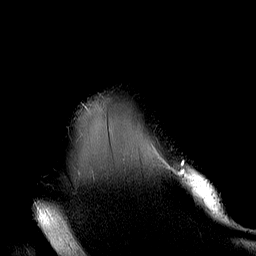

[Series 7: t2_sag_obl_fs_blade · oblique · 4.0mm · 0.61mm/px · 7 of 24 slices shown]
[im 1/24]
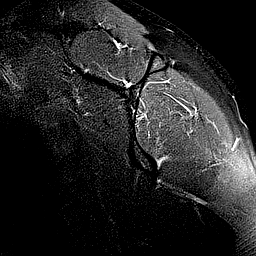
[im 4/24]
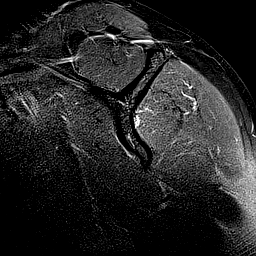
[im 8/24]
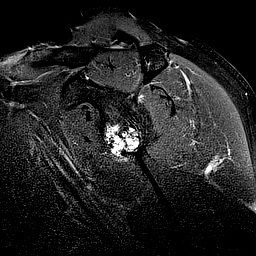
[im 12/24]
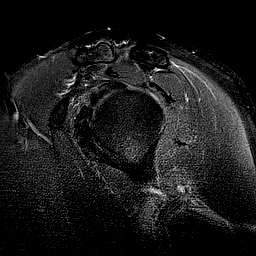
[im 16/24]
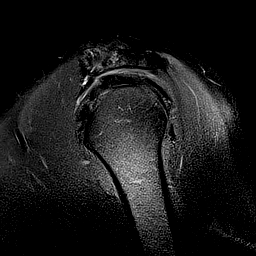
[im 20/24]
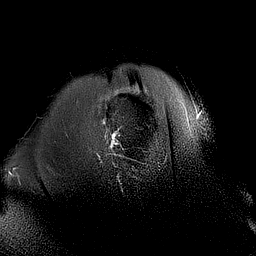
[im 24/24]
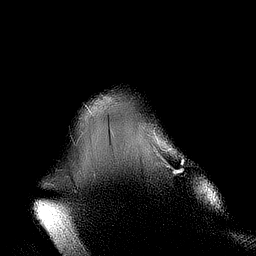

[Series 8: t1_sag_obl_blade · oblique · 4.0mm · 0.61mm/px · 7 of 24 slices shown]
[im 1/24]
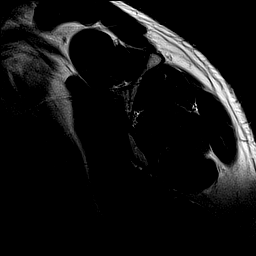
[im 4/24]
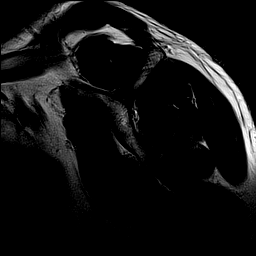
[im 8/24]
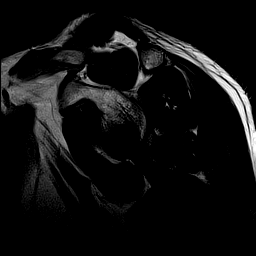
[im 12/24]
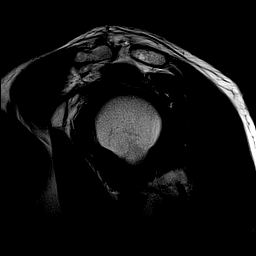
[im 16/24]
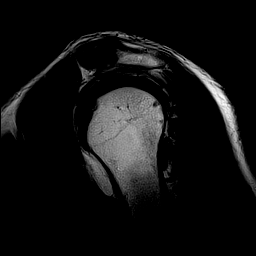
[im 20/24]
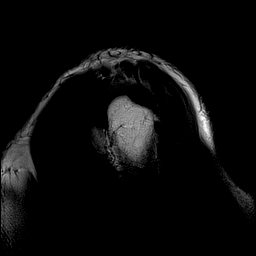
[im 24/24]
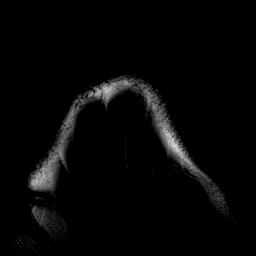

[34 of 40 positions shown; findings below may reference images not displayed]

FINDINGS: Mild insertional tendinosis of the supraspinatus and infraspinatus tendons. No full or partial-thickness tears.

Mild interstitial insertional tearing of the subscapularis tendon.

Teres minor tendon intact and unremarkable.  Mild tendinosis intra-articular portion long head of biceps tendon. Tendon is normally positioned.

The acromioclavicular joint demonstrates moderate osteoarthritis. Associated reactive subcortical marrow edema. Downsloping acromion. Mild mass effect. Correlate for impingement.  No os acromiale or subacromial spur is seen.

Labrum intact.

Moderately advanced chondral surface irregularity glenohumeral articulation most pronounced along the inferior aspect of the glenoid where there is significant associated reactive subcortical marrow edema and subcortical geode formation. 

There is no  glenohumeral joint effusion.  There is no fluid in the subacromial/subdeltoid bursa.

Osseous structures demonstrate no fractures or destructive lesions.

No muscle atrophy. No denervation edema.
IMPRESSION: 1.
Moderately advanced chondral surface irregularity glenohumeral articulation most pronounced along the inferior aspect of the glenoid where there is significant associated reactive subcortical marrow edema and subcortical geode formation.

2.
Mild insertional tendinosis of the supraspinatus and infraspinatus tendons. Mild interstitial insertional tearing of the subscapularis tendon.

3.
Mild tendinosis intra-articular portion long head of biceps tendon.

4.
Moderate acromioclavicular osteoarthritis with associated reactive subcortical marrow edema. Downsloping acromion. Mild mass effect. Correlate for impingement.

## 2021-06-30 IMAGING — CR T-SPINE 2 VWS
1 series · 3 of 3 positions shown · non-contrast
Comparison: None.

HISTORY: 49 year-old male with acute back pain .
TECHNIQUE: 3 views of thoracic spine.

[Series 1: t thoracic spine ap · 0.15mm/px · 3 of 3 slices shown]
[im 1/3]
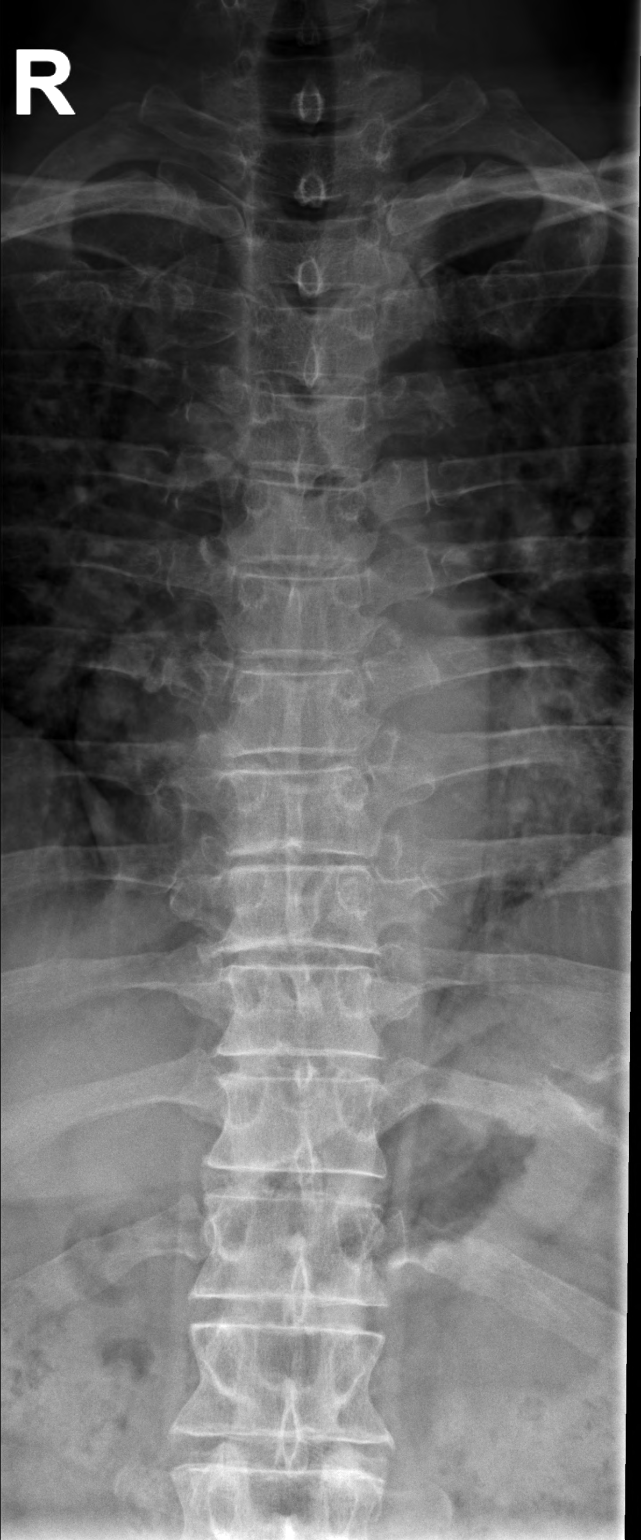
[im 2/3]
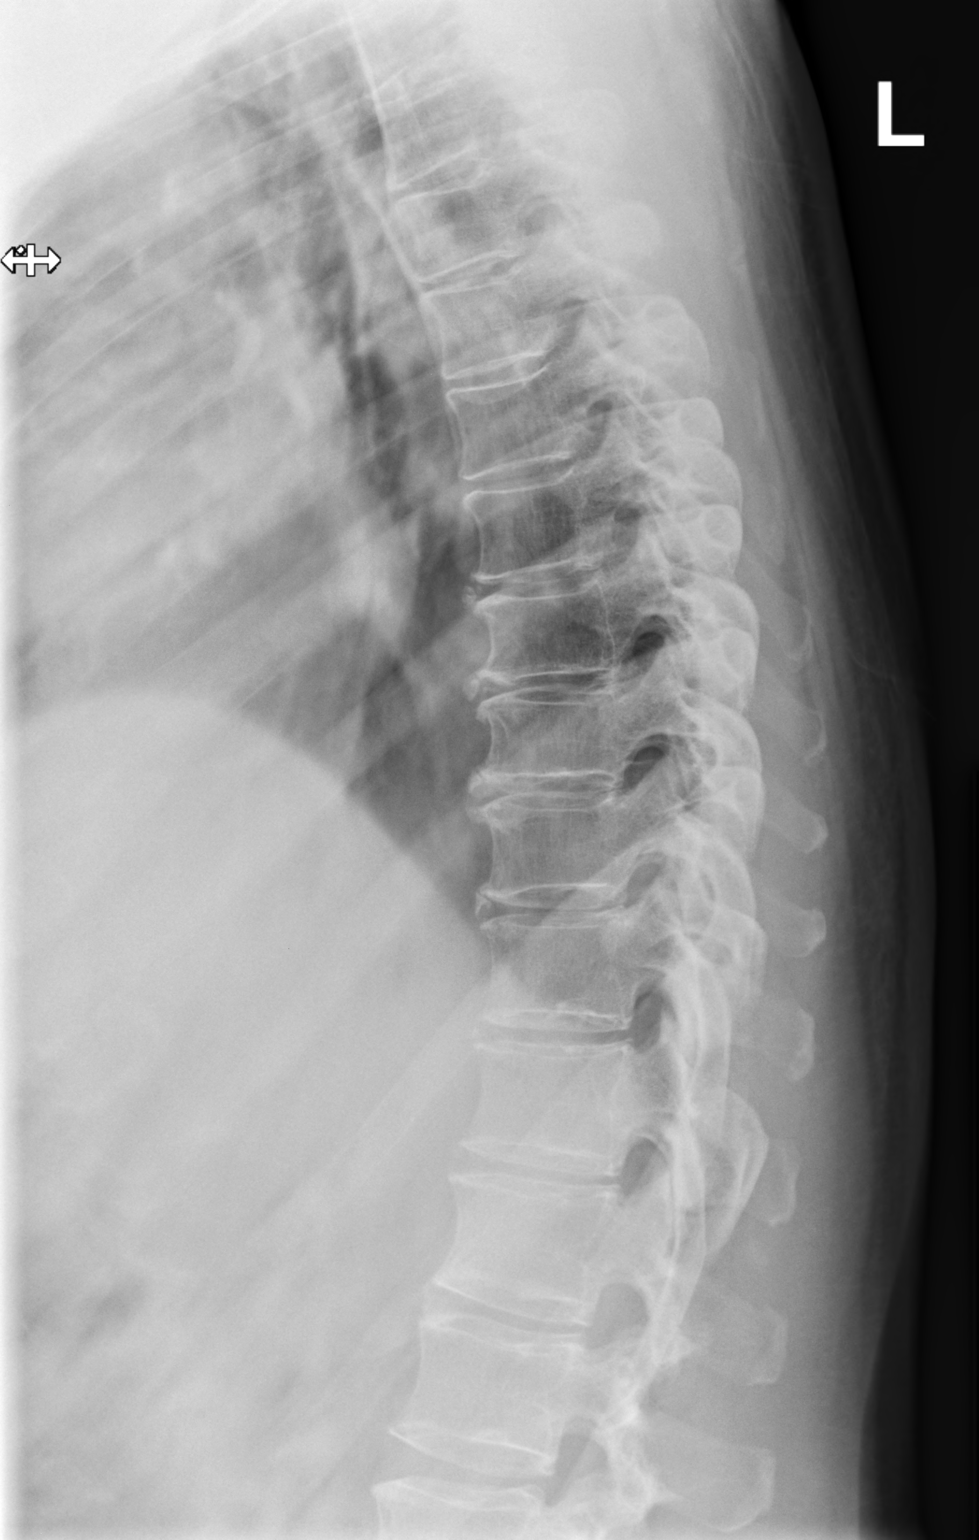
[im 3/3]
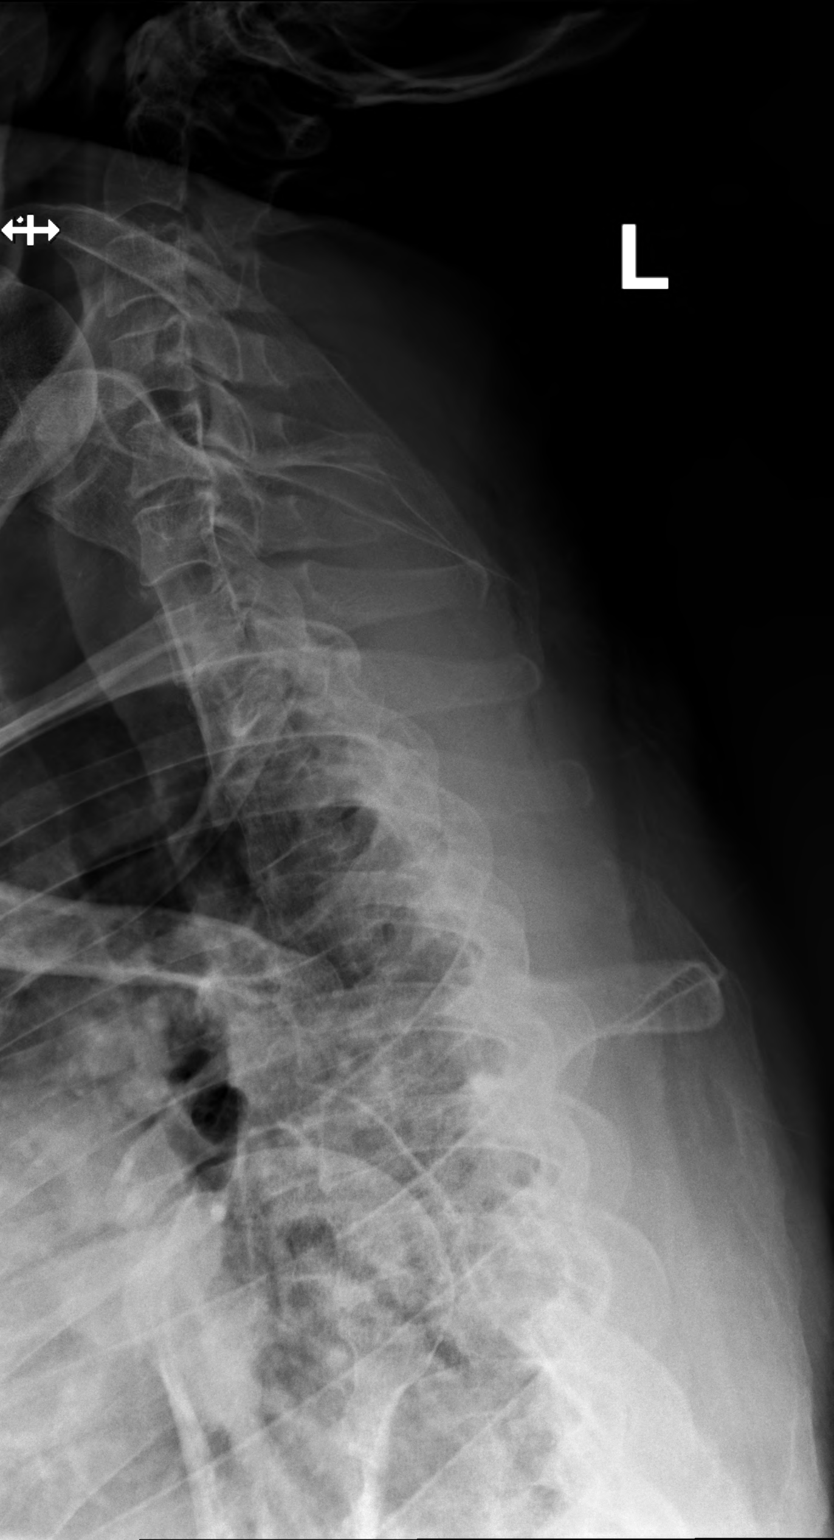

[3 of 3 positions shown; findings below may reference images not displayed]

FINDINGS: The vertebral body height and alignment are within normal limits. The bones and soft tissues are also unremarkable. The visualized portion of the heart, mediastinum and the lungs are unremarkable.

Mild degenerative disc changes present in the thoracic spine.
IMPRESSION: No acute finding in the thoracic spine. Consider MRI evaluation if patient has persistent pain.

## 2021-06-30 IMAGING — CR L-SPINE 2-3 VWS
1 series · 3 of 3 positions shown · non-contrast
Comparison: None.

HISTORY: 49 year-old male with acute back pain.
TECHNIQUE: 5 views of lumbar spine.

[Series 1: t lumbar spine ap · 0.15mm/px · 3 of 3 slices shown]
[im 1/3]
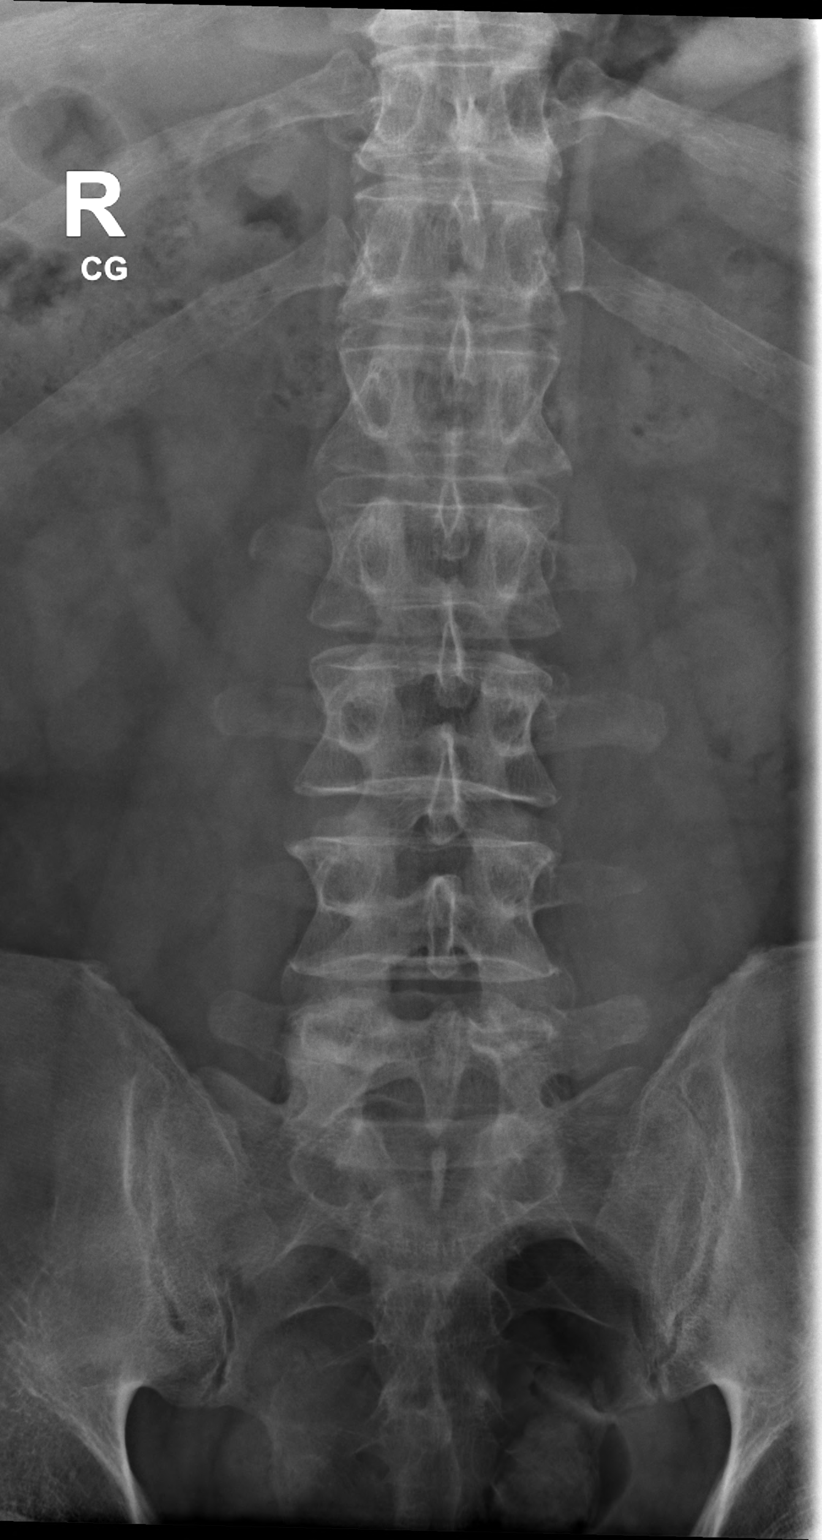
[im 2/3]
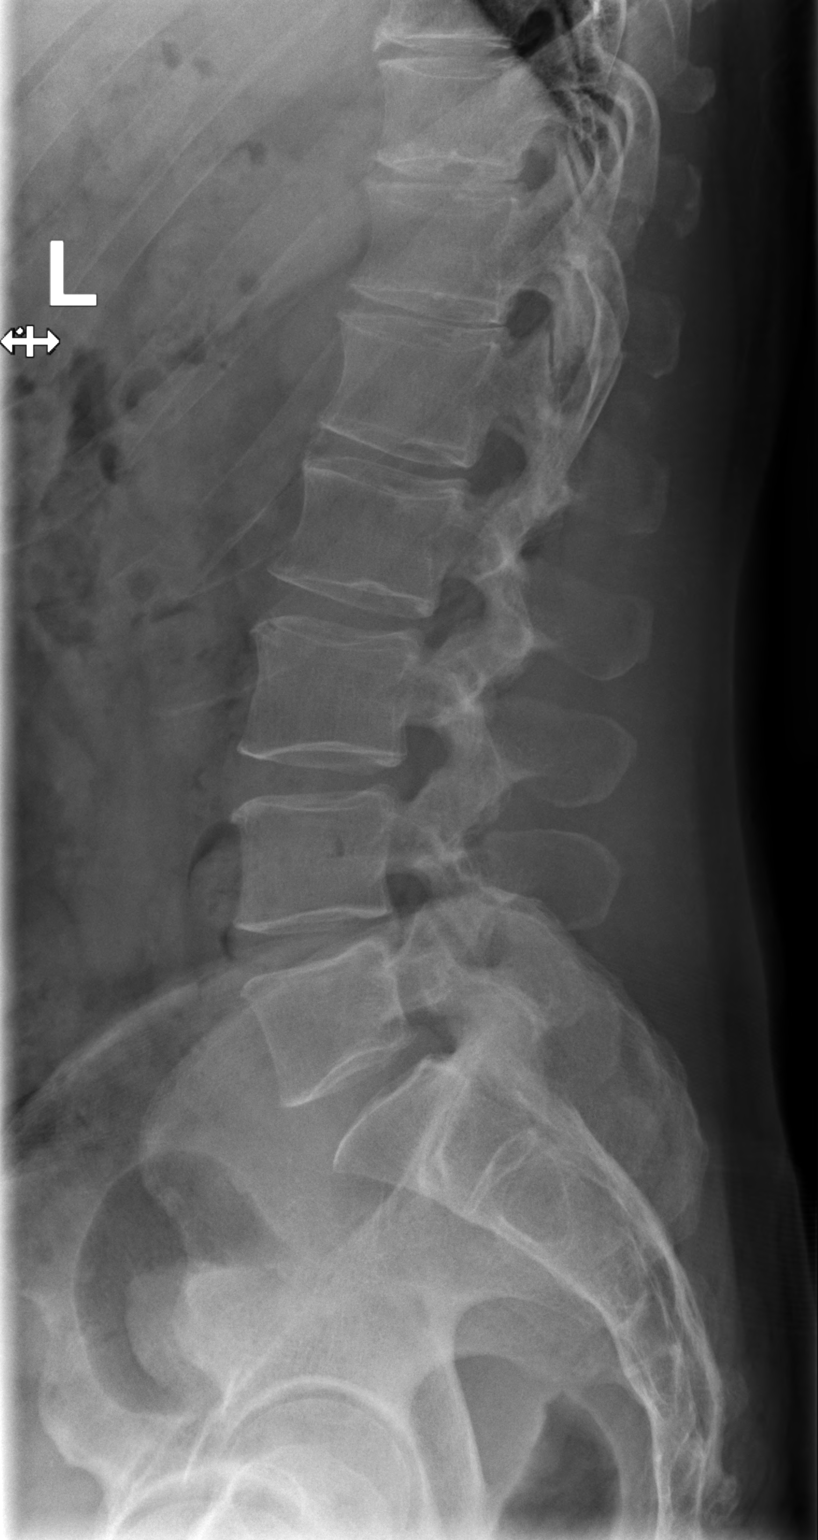
[im 3/3]
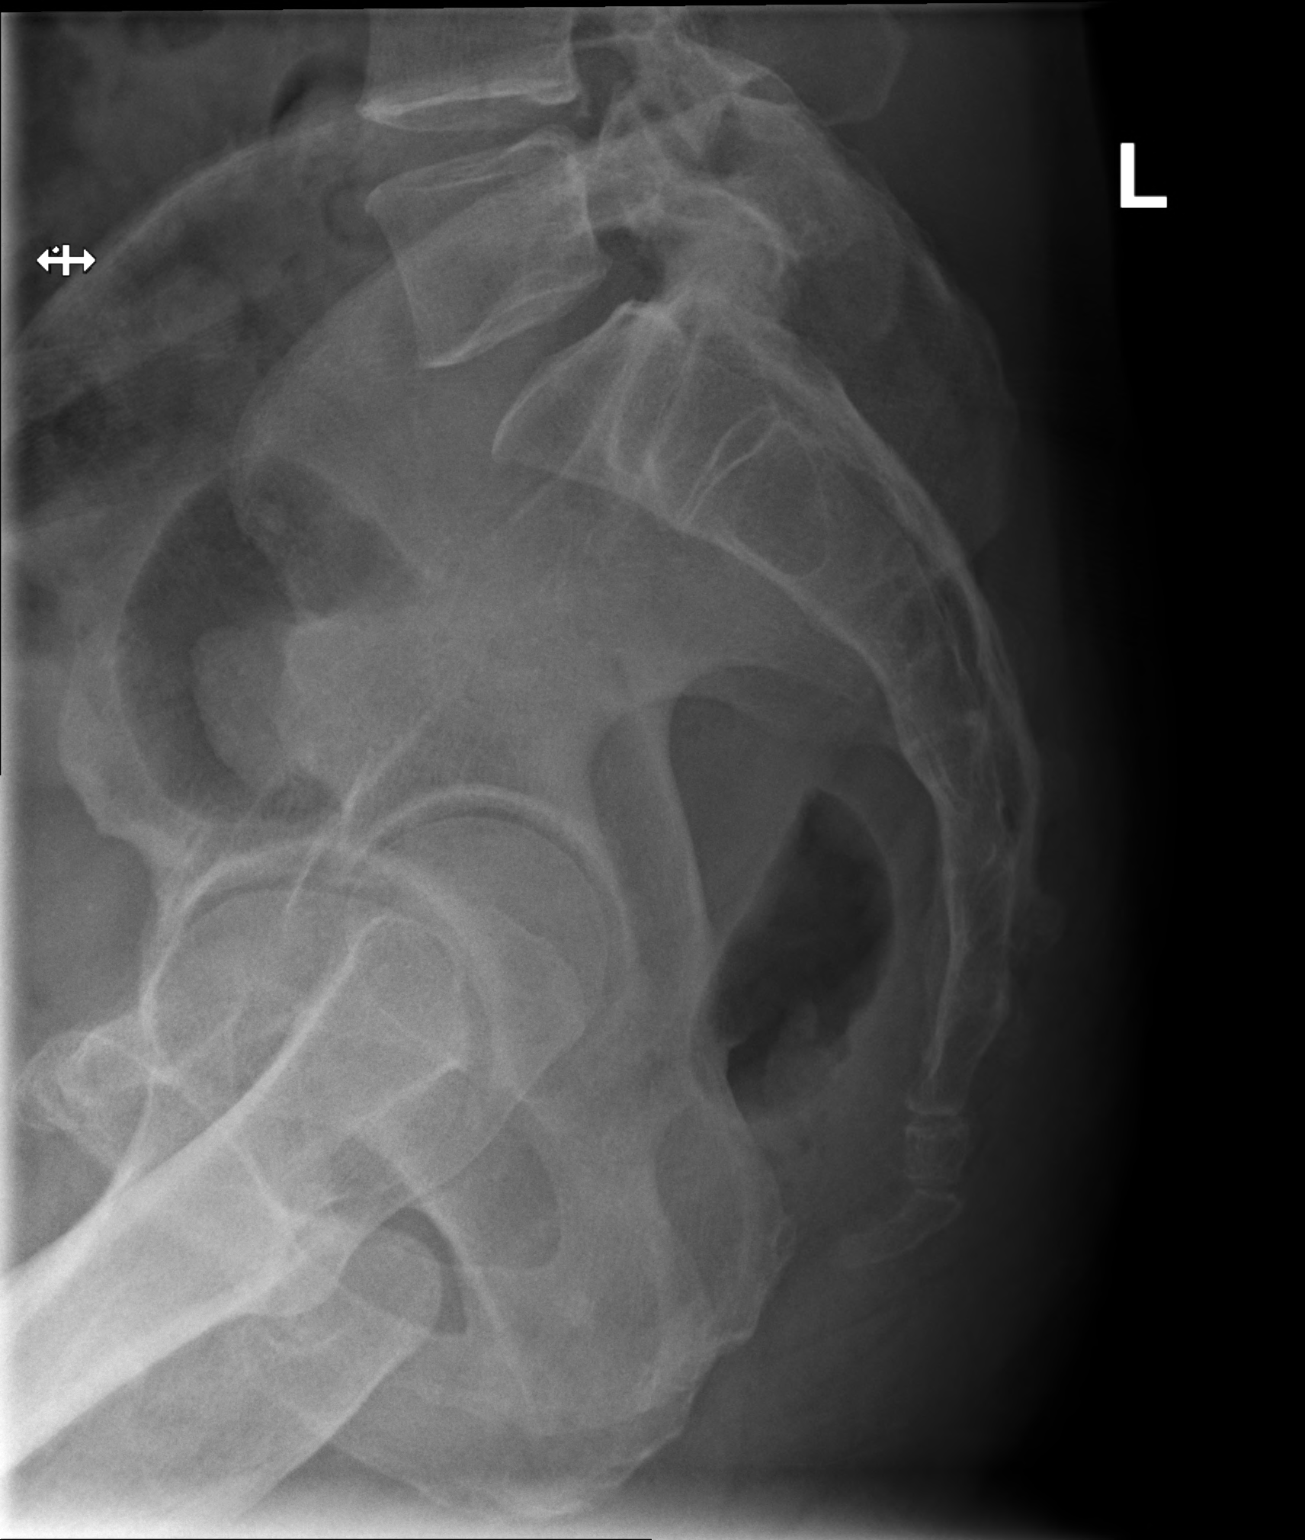

[3 of 3 positions shown; findings below may reference images not displayed]

FINDINGS: Alignment: Lumbar spine alignment is unremarkable.

Degenerative disc changes: No significant degenerative changes are seen.

Facet arthropathy changes: Identified L5-S1 level.

Bones: There are 5 lumbar type vertebral bodies. Vertebral body heights are maintained. No acute fractures identified. The pedicles are intact. Sacroiliac joints appear unremarkable. Partially visualized osseous structures of the pelvis are grossly unremarkable.

Atherosclerosis (plaque/vascular calcification): Absent.
IMPRESSION: Facet arthropathy changes present L5-S1 level.

## 2021-10-24 IMAGING — US LIVER ELASTOGRAPHY
1 series · 9 of 9 positions shown · non-contrast
Comparison: 03/07/2021 ultrasound of abdomen

HISTORY: 50 year-old male with abnormal imaging of liver .

[Series 1: liver elastography · 9 of 9 slices shown]
[im 1/9]
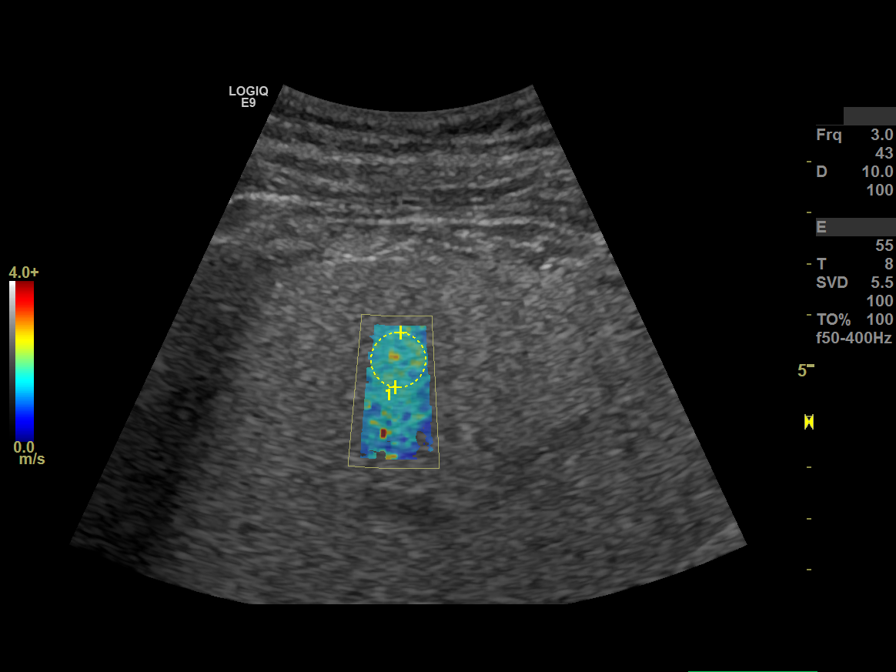
[im 2/9]
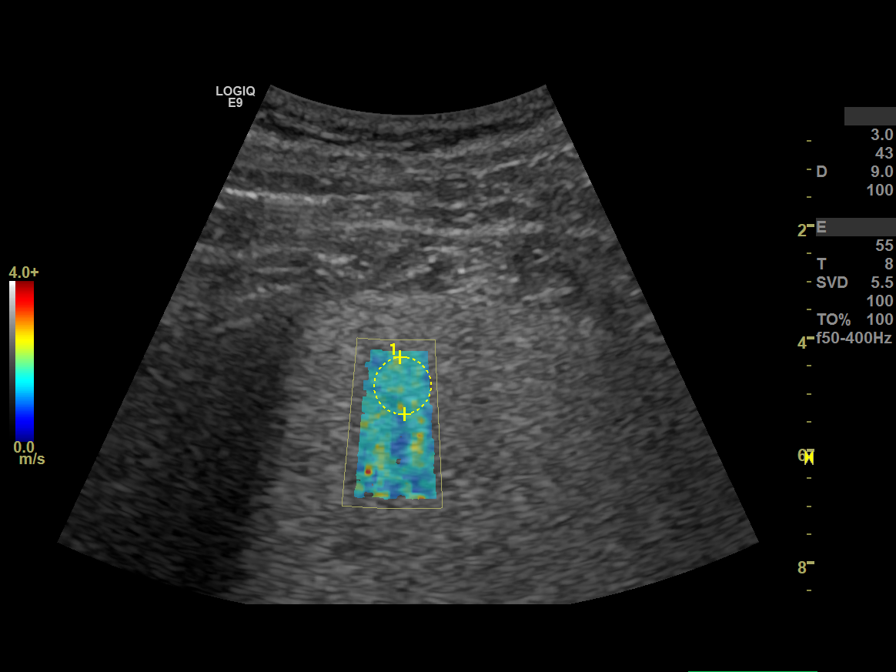
[im 3/9]
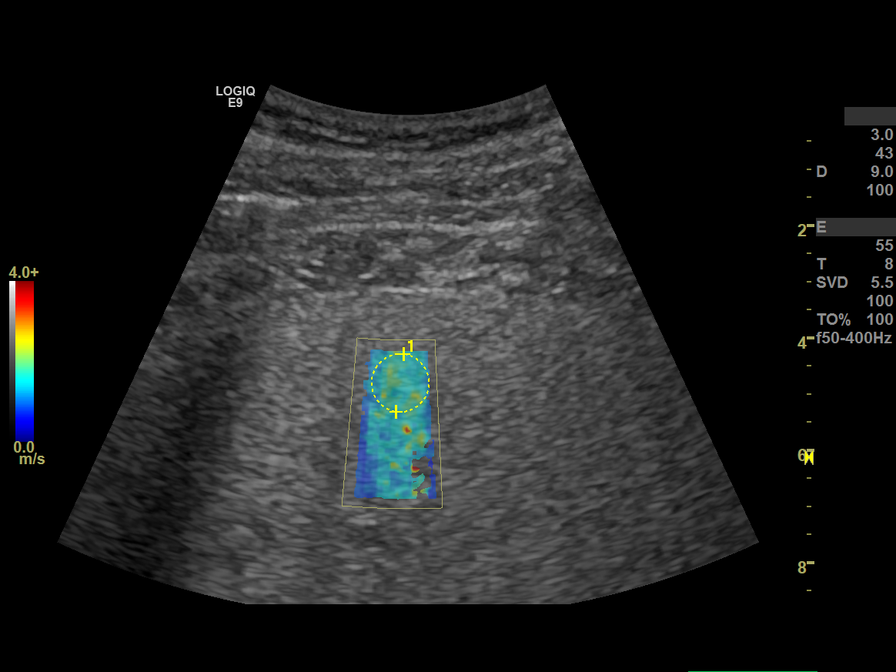
[im 4/9]
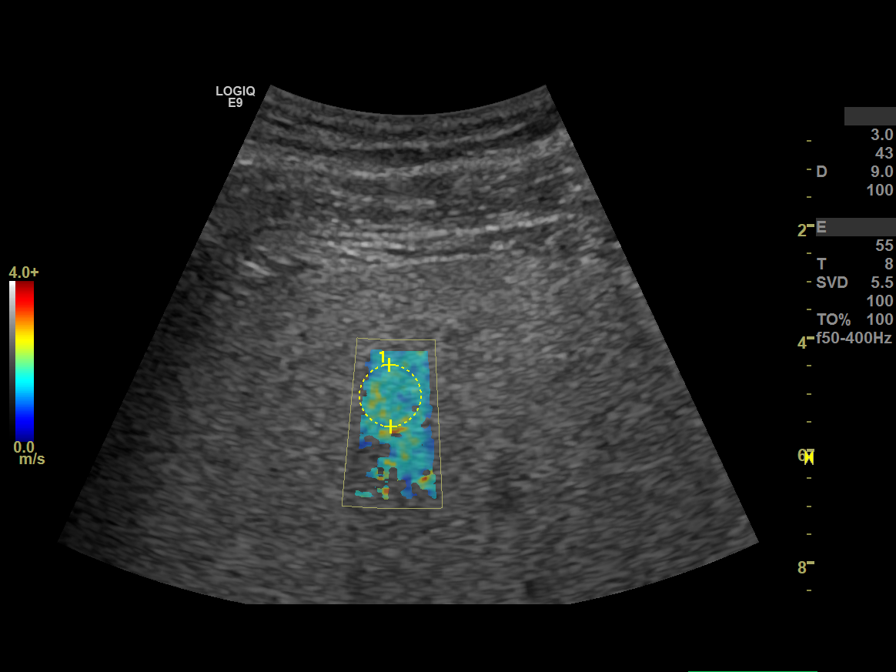
[im 5/9]
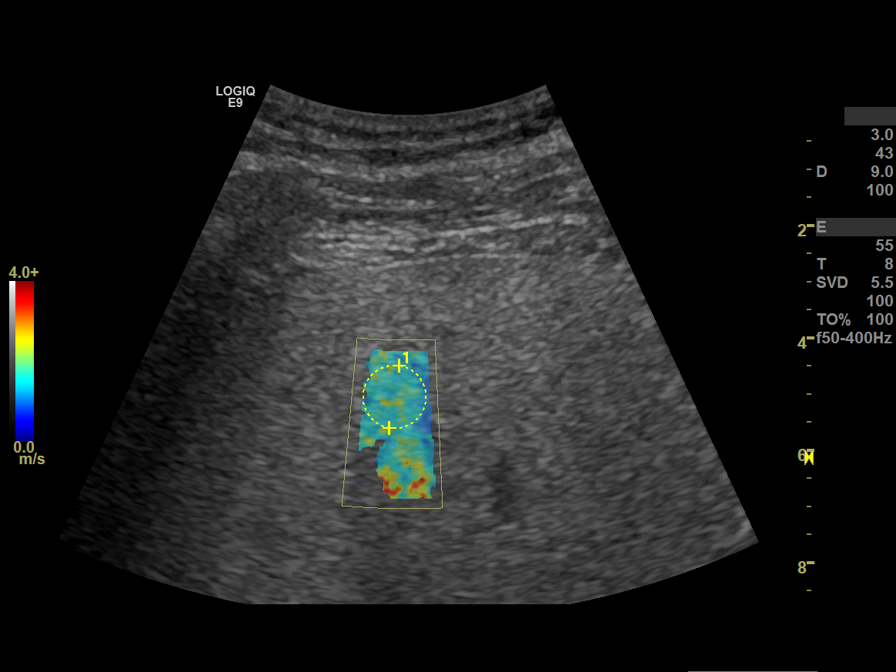
[im 6/9]
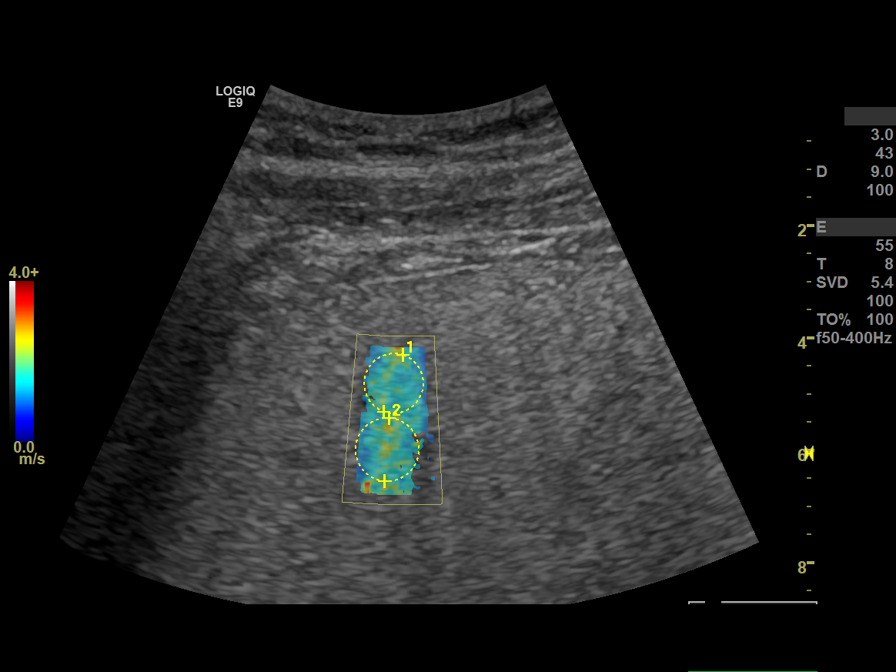
[im 7/9]
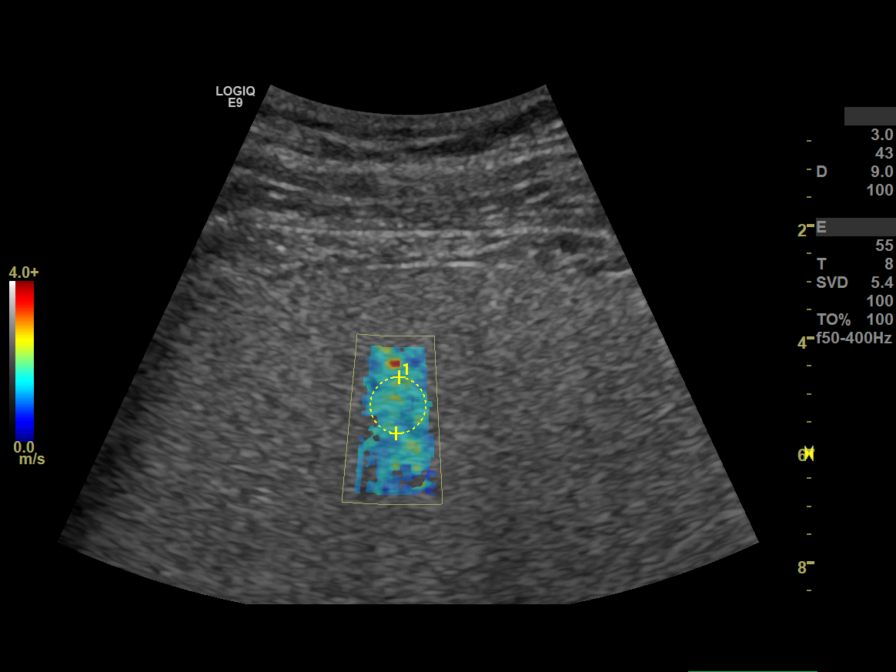
[im 8/9]
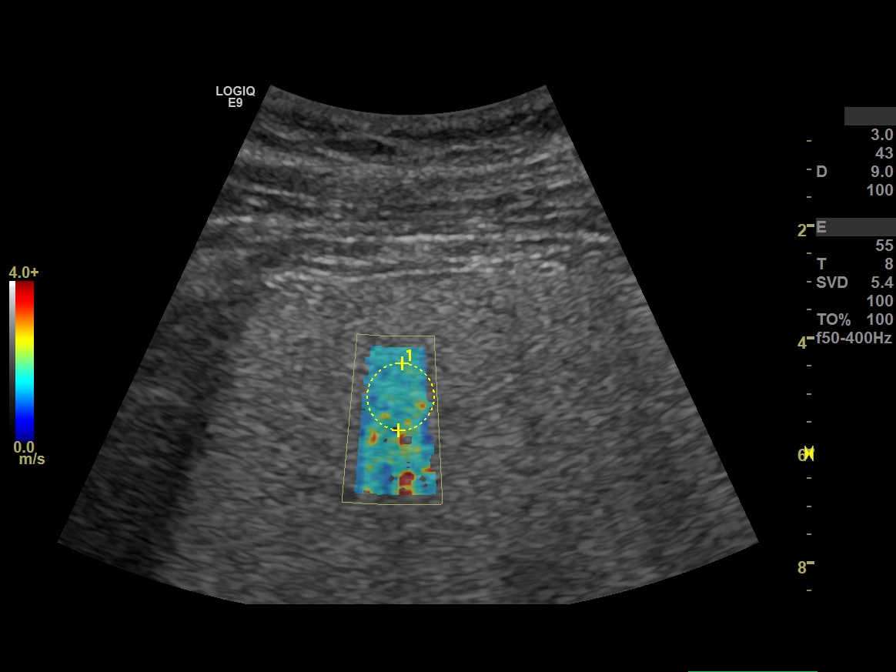
[im 9/9]
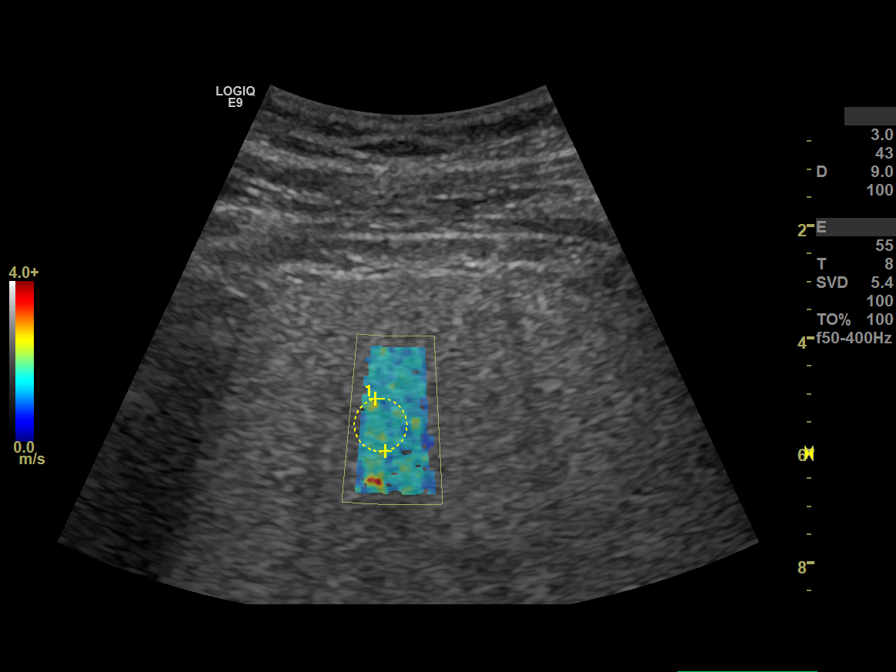

[9 of 9 positions shown; findings below may reference images not displayed]

US ELASTOGRAPHY E9

10  shear wave measurements were acquired using a right intercostal approach, with median of 1.57 m/s (range, 1.50 - 1.61 m/s). The IQR/median value is 3.9%. Measurements were obtained using a C1-6 transducer.
IMPRESSION: 1. Liver fibrosis staging, NORMAL-MILD.

2. Median liver shear wave speed = 1.57 m/s.

3. METAVIR SCORE F1.

GE E9 Shear Wave Elastography Reference Ranges

Liver Fibrosis Staging      METAVIR SCORE                    m/s

Normal-Mild                        F1                              1.35-1.66 m/s

Mild-Moderate                    F2                              1.66-1.77 m/s

Moderate-Severe               F3                              1.77-1.99 m/s

Cirrhosis                            F4                                     >1.99 m/s

Note, ultrasound shear wave measurements may also be affected by presence of hepatic congestion, steatosis and inflammation. Shear wave speed measurements should be interpreted in conjunction with other available clinical data, and they should not be considered interchangeable with MRI- derived stiffness measurements at this time.

## 2021-12-08 ENCOUNTER — Inpatient Hospital Stay: Admit: 2021-12-08 | Discharge: 2021-12-08 | Payer: PRIVATE HEALTH INSURANCE | Primary: Internal Medicine

## 2022-03-06 ENCOUNTER — Encounter: Admit: 2022-03-06 | Payer: PRIVATE HEALTH INSURANCE | Attending: Internal Medicine | Primary: Internal Medicine

## 2022-03-06 DIAGNOSIS — R221 Localized swelling, mass and lump, neck: Secondary | ICD-10-CM

## 2022-03-29 ENCOUNTER — Inpatient Hospital Stay: Admit: 2022-03-29 | Discharge: 2022-03-29 | Payer: MEDICAID | Primary: Internal Medicine

## 2022-03-29 DIAGNOSIS — R221 Localized swelling, mass and lump, neck: Secondary | ICD-10-CM

## 2022-05-19 ENCOUNTER — Encounter: Admit: 2022-05-19 | Payer: PRIVATE HEALTH INSURANCE | Primary: Internal Medicine

## 2022-05-19 ENCOUNTER — Inpatient Hospital Stay: Admit: 2022-05-19 | Discharge: 2022-05-19 | Payer: MEDICAID

## 2022-05-19 DIAGNOSIS — T8859XA Other complications of anesthesia, initial encounter: Secondary | ICD-10-CM

## 2022-05-19 DIAGNOSIS — L905 Scar conditions and fibrosis of skin: Secondary | ICD-10-CM

## 2022-05-19 DIAGNOSIS — R200 Anesthesia of skin: Secondary | ICD-10-CM

## 2022-05-19 DIAGNOSIS — E7800 Pure hypercholesterolemia, unspecified: Secondary | ICD-10-CM

## 2022-05-19 MED ORDER — KETOROLAC 15 MG/ML INJECTION SOLUTION
15 mg/mL | Freq: Once | INTRAMUSCULAR | Status: CP
Start: 2022-05-19 — End: ?
  Administered 2022-05-20: 03:00:00 15 mL via INTRAMUSCULAR

## 2022-05-19 MED ORDER — DIPHTH,PERTUSSIS(ACELL),TETANUS 2.5 LF UNIT-8 MCG-5 LF/0.5 ML IM SUSP
0.5 mL | INTRAMUSCULAR | Status: CP
Start: 2022-05-19 — End: ?
  Administered 2022-05-20: 03:00:00 0.5 mL via INTRAMUSCULAR

## 2022-05-20 NOTE — Discharge Instructions
Apply bacitracin over wound daily. Follow up with the primary care doctor this week
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
## 2022-08-10 ENCOUNTER — Inpatient Hospital Stay: Admit: 2022-08-10 | Discharge: 2022-08-10 | Payer: PRIVATE HEALTH INSURANCE | Primary: Internal Medicine

## 2022-09-02 ENCOUNTER — Encounter: Admit: 2022-09-02 | Payer: PRIVATE HEALTH INSURANCE | Attending: Internal Medicine | Primary: Internal Medicine

## 2022-09-02 DIAGNOSIS — M542 Cervicalgia: Secondary | ICD-10-CM

## 2022-09-05 ENCOUNTER — Ambulatory Visit: Admit: 2022-09-05 | Payer: MEDICAID | Attending: Orthopedic Surgery | Primary: Internal Medicine

## 2022-09-05 ENCOUNTER — Inpatient Hospital Stay: Admit: 2022-09-05 | Discharge: 2022-09-05 | Payer: MEDICAID | Primary: Internal Medicine

## 2022-09-05 ENCOUNTER — Encounter: Admit: 2022-09-05 | Payer: PRIVATE HEALTH INSURANCE | Attending: Internal Medicine | Primary: Internal Medicine

## 2022-09-05 ENCOUNTER — Encounter: Admit: 2022-09-05 | Payer: PRIVATE HEALTH INSURANCE | Attending: Orthopedic Surgery | Primary: Internal Medicine

## 2022-09-05 DIAGNOSIS — M542 Cervicalgia: Secondary | ICD-10-CM

## 2022-09-05 DIAGNOSIS — E7800 Pure hypercholesterolemia, unspecified: Secondary | ICD-10-CM

## 2022-09-05 DIAGNOSIS — M25512 Pain in left shoulder: Secondary | ICD-10-CM

## 2022-09-05 DIAGNOSIS — L905 Scar conditions and fibrosis of skin: Secondary | ICD-10-CM

## 2022-09-05 DIAGNOSIS — M545 Low back pain, unspecified back pain laterality, unspecified chronicity, unspecified whether sciatica present: Secondary | ICD-10-CM

## 2022-09-05 DIAGNOSIS — S161XXA Strain of muscle, fascia and tendon at neck level, initial encounter: Secondary | ICD-10-CM

## 2022-09-05 DIAGNOSIS — R200 Anesthesia of skin: Secondary | ICD-10-CM

## 2022-09-05 DIAGNOSIS — T8859XA Other complications of anesthesia, initial encounter: Secondary | ICD-10-CM
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
## 2022-09-05 NOTE — Patient Instructions
*  CALL [phone] TO SCHEDULE MRI OF THE LEFT SHOULDER.

## 2022-09-06 ENCOUNTER — Ambulatory Visit: Admit: 2022-09-06 | Payer: MEDICAID | Attending: Orthopedic Surgery | Primary: Internal Medicine

## 2022-09-06 ENCOUNTER — Encounter: Admit: 2022-09-06 | Payer: PRIVATE HEALTH INSURANCE | Primary: Internal Medicine

## 2022-09-10 ENCOUNTER — Ambulatory Visit: Admit: 2022-09-10 | Payer: MEDICAID | Primary: Internal Medicine

## 2022-09-10 ENCOUNTER — Ambulatory Visit: Admit: 2022-09-10 | Payer: MEDICAID | Attending: Orthopedic Surgery | Primary: Internal Medicine

## 2022-09-10 ENCOUNTER — Encounter: Admit: 2022-09-10 | Payer: PRIVATE HEALTH INSURANCE | Attending: Orthopedic Surgery | Primary: Internal Medicine

## 2022-09-10 DIAGNOSIS — T8859XA Other complications of anesthesia, initial encounter: Secondary | ICD-10-CM

## 2022-09-10 DIAGNOSIS — L905 Scar conditions and fibrosis of skin: Secondary | ICD-10-CM

## 2022-09-10 DIAGNOSIS — E7800 Pure hypercholesterolemia, unspecified: Secondary | ICD-10-CM

## 2022-09-10 DIAGNOSIS — M5010 Cervical disc disorder with radiculopathy, unspecified cervical region: Secondary | ICD-10-CM

## 2022-09-10 DIAGNOSIS — R200 Anesthesia of skin: Secondary | ICD-10-CM

## 2022-09-10 MED ORDER — MELOXICAM 7.5 MG TABLET
7.5 mg | ORAL | Status: AC
Start: 2022-09-10 — End: ?
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
## 2022-09-26 ENCOUNTER — Inpatient Hospital Stay: Admit: 2022-09-26 | Discharge: 2022-09-26 | Payer: MEDICAID | Primary: Internal Medicine

## 2022-09-26 ENCOUNTER — Ambulatory Visit: Admit: 2022-09-26 | Payer: MEDICAID | Primary: Internal Medicine

## 2022-09-26 DIAGNOSIS — M25512 Pain in left shoulder: Secondary | ICD-10-CM

## 2022-09-26 DIAGNOSIS — M5010 Cervical disc disorder with radiculopathy, unspecified cervical region: Secondary | ICD-10-CM

## 2022-09-27 ENCOUNTER — Telehealth: Admit: 2022-09-27 | Payer: PRIVATE HEALTH INSURANCE | Attending: Orthopedic Surgery | Primary: Internal Medicine

## 2022-09-27 NOTE — Telephone Encounter
Called patient to discuss MRI results.  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  Informed patient that if he is still having significant pain symptoms, he can consider CSI to the left subacromial space.  Patient would like to move forward with this procedure.  Informed patient that he will receive a call from scheduling to arrange a visit for CSI to the left shoulder.  Patient verbalized understanding.  All of his questions were answered.[name], MSN, APRN, FNP-BCDepartment of Orthopaedics & Rehabilitation - Sports MedicineY[facility] of Medicine

## 2022-10-01 ENCOUNTER — Encounter: Admit: 2022-10-01 | Payer: MEDICAID | Attending: Orthopedic Surgery | Primary: Internal Medicine

## 2022-10-01 ENCOUNTER — Telehealth: Admit: 2022-10-01 | Payer: PRIVATE HEALTH INSURANCE | Attending: Orthopedic Surgery | Primary: Internal Medicine

## 2022-10-01 ENCOUNTER — Encounter: Admit: 2022-10-01 | Payer: PRIVATE HEALTH INSURANCE | Attending: Orthopedic Surgery | Primary: Internal Medicine

## 2022-10-01 ENCOUNTER — Ambulatory Visit: Admit: 2022-10-01 | Payer: MEDICAID | Attending: Orthopedic Surgery | Primary: Internal Medicine

## 2022-10-01 DIAGNOSIS — R200 Anesthesia of skin: Secondary | ICD-10-CM

## 2022-10-01 DIAGNOSIS — E7800 Pure hypercholesterolemia, unspecified: Secondary | ICD-10-CM

## 2022-10-01 DIAGNOSIS — L905 Scar conditions and fibrosis of skin: Secondary | ICD-10-CM

## 2022-10-01 DIAGNOSIS — M5010 Cervical disc disorder with radiculopathy, unspecified cervical region: Secondary | ICD-10-CM

## 2022-10-01 DIAGNOSIS — T8859XA Other complications of anesthesia, initial encounter: Secondary | ICD-10-CM

## 2022-10-01 DIAGNOSIS — M67814 Other specified disorders of tendon, left shoulder: Secondary | ICD-10-CM

## 2022-10-01 MED ORDER — TRIAMCINOLONE ACETONIDE 40 MG/ML SUSPENSION FOR INJECTION
40 mg/mL | Freq: Once | INTRA_ARTICULAR | Status: CP | PRN
Start: 2022-10-01 — End: ?
  Administered 2022-10-01: 21:00:00 40 mg/mL via INTRA_ARTICULAR

## 2022-10-01 NOTE — Telephone Encounter
Patient is scheduled for CSI on 12/28 with [name].  He is asking if he can come in sooner for injection.  Please advise if he can be seen by any APP for this and if it is within an appropriate timeframe to be scheduled to a sooner day

## 2022-10-01 NOTE — Telephone Encounter
Appointment able to be moved up to today at 4 pm in [place].  Accepted appt-address provided to [employer].

## 2022-10-01 NOTE — Progress Notes
Patient name: Aaron Irwin: [id number] Date of birth: [birth year]Date of visit: 12/12/2023Provider: Francena Hanly, APRNCC:  Continued left shoulder pain since last visit.HPI: 51 y.o. right hand dominant male presents for review of left shoulder MRI and subacromial CSI.  [name] symptoms have not changed since his last visit.Past Medical History:  has a past medical history of Anesthesia, Anesthesia complication (01/19/2020), Hypercholesteremia, and Scar.Past Surgical History:  has a past surgical history that includes Knee Arthrocentesis and Inguinal hernia repair (01/19/2020).Family History: family history includes Hypertension in his mother.Social History was reviewed and is in EPIC chart.Allergies: Patient has no known allergies.Medications: has a current medication list which includes the following prescription(s): atorvastatin, meloxicam, and multivitamin.Review of Systems: I have reviewed the review of systems and it is listed in the clinical support section of the record.Occupation:  Systems administratorPhysical Exam: Vital signs: Ht 6' 1 (1.854 m)  - Wt 95.3 kg  - BMI 27.72 kg/m? Constitutional: The patient is alert, interactive, well-nourished and developed, and in no acute distress.Neurologic: Alert and oriented x4 with a non-antalgic gait.Psychologic: Demonstrates appropriate interactions, mood, and affect.Head: The patient?s head is proportional in size to their body and their face is symmetric.Neck: Midline positioning and full AROM without pain.Skin: The extremities are without evidence of rash, erythema, or trophic changes.Lymph: No generalized lymphedema to the extremities. Cardiovascular: Palpable radial pulses. The extremities are warm and well perfused.Left shoulder:	*Physical exam was deferred as visit was spent discussing symptoms, discussing treatment options, providing patient education, and formulating a plan of care.Imaging: MRI from 09/26/2022 of the left shoulder demonstrates evidence of mild supraspinatus and infraspinatus tendinosis.  There is also evidence of os acromiale.Assessment: 51 y.o. 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.Treatment Plan: Clinical assessment and imaging was reviewed with the patient.Based on above assessment the patient's treatment options include a CSI to the left subacromial space to try and decrease inflammation and improve pain symptoms.After discussing the treatment options with the patient, they have decided to proceed with the above stated options.All of the patient's questions have been answered and they are in agreement with this treatment plan.The patient is to follow-up PRN.Large Joint Injection: L subacromial bursa on 10/01/2022 4:00 PMIndications: painDetails: 22 G needle, posterior approachMedications: 40 mg triamcinolone acetonide 40 mg/mLProcedure Note: Cortisone injection - Left subacromial spaceAfter additional review of the risks, benefits, and rationale of a cortisone injection, the patient desired to proceed with the injection.The risks discussed include, but are not limited to, infection, bleeding, hematoma formation, neuroma, neuritis, skin dimpling, hyper or hypopigmentation of the skin, and steroid flare.After sterile application of a chlorhexidine preparation to the posterolateral aspect of the shoulder and the use of ethyl chloride for skin anesthesia, 4 mL of 1% lidocaine and 1 mL of Kenalog (40 mg) was injected into the subacromial space of the shoulder. A dressing was applied to the injection site.The patient tolerated the injection well and activity modification for the first week after the injection was reviewed with the patient.Procedure, treatment alternatives, risks and benefits explained, specific risks discussed. Consent was given by the patient. [name], MSN, APRN, FNP-BCDepartment of Orthopaedics & Rehabilitation - Sports MedicineY[facility] of [facility]

## 2022-10-01 NOTE — Progress Notes
Review of Systems Musculoskeletal:      Follow up cervical spine pain.  Patient is here to review recent MRI and reports 8/10 pain today.
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
## 2022-10-02 ENCOUNTER — Encounter: Admit: 2022-10-02 | Payer: MEDICAID | Attending: Pain Medicine | Primary: Internal Medicine

## 2022-10-02 ENCOUNTER — Ambulatory Visit: Admit: 2022-10-02 | Payer: MEDICAID | Attending: Pain Medicine | Primary: Internal Medicine

## 2022-10-02 DIAGNOSIS — M7918 Myalgia, other site: Secondary | ICD-10-CM

## 2022-10-02 DIAGNOSIS — M67912 Unspecified disorder of synovium and tendon, left shoulder: Secondary | ICD-10-CM

## 2022-10-02 DIAGNOSIS — M546 Pain in thoracic spine: Secondary | ICD-10-CM

## 2022-10-02 DIAGNOSIS — M47812 Spondylosis without myelopathy or radiculopathy, cervical region: Secondary | ICD-10-CM

## 2022-10-03 ENCOUNTER — Telehealth: Admit: 2022-10-03 | Payer: PRIVATE HEALTH INSURANCE | Attending: Pain Medicine | Primary: Internal Medicine

## 2022-10-03 NOTE — Telephone Encounter
Outbound call  - advised patient no earlier appointments on wait list. Thank you.
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
## 2022-10-03 NOTE — Telephone Encounter
Patient is asking if there is an earlier date than 11/11/2022 for his procedure. Please call patient at [phone] to advise/assist- OK to leave a message. Thank you.

## 2022-10-06 IMAGING — MR MRI LSPINE WO CONTRAST
4 of 5 series · 16 of 48 positions shown · non-contrast
Comparison: 3 views of lumbar spine study 06/30/2021.

Images Obtained from [facility]
HISTORY: 51 years-old Male with low back pain, right leg/hip pain.
TECHNIQUE: Multiplanar multisequential MR images were obtained of the lumbar spine without contrast administration.

[Series 16: t2_sag · sagittal · 4.0mm · 0.45mm/px · 7 of 16 slices shown]
[im 1/16]
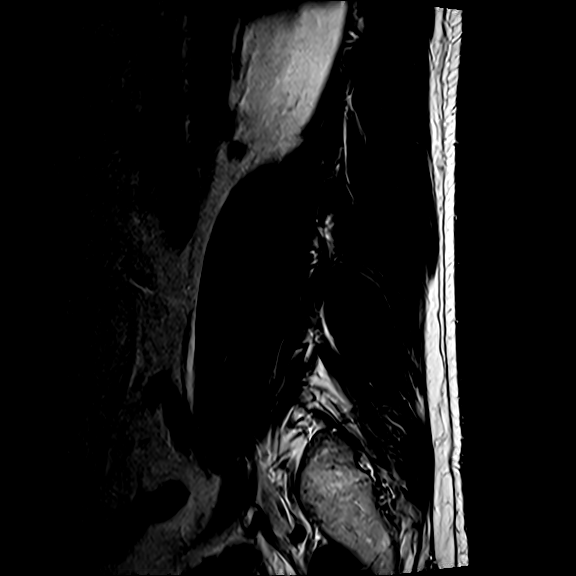
[im 3/16]
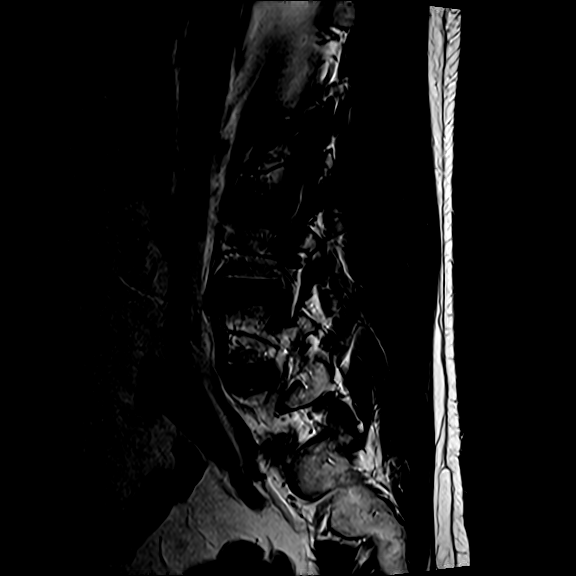
[im 6/16]
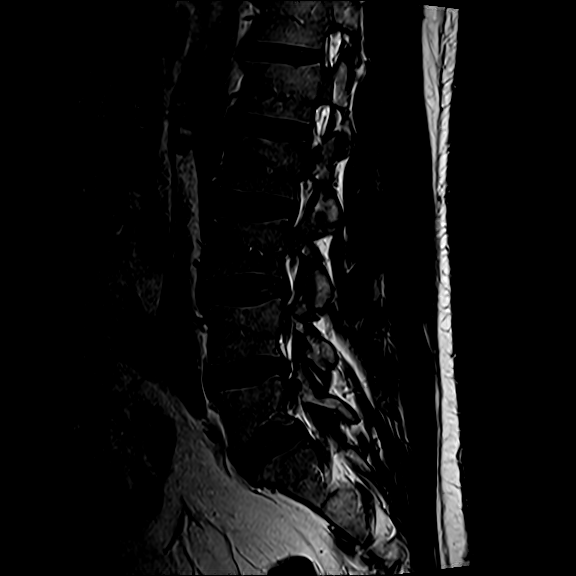
[im 8/16]
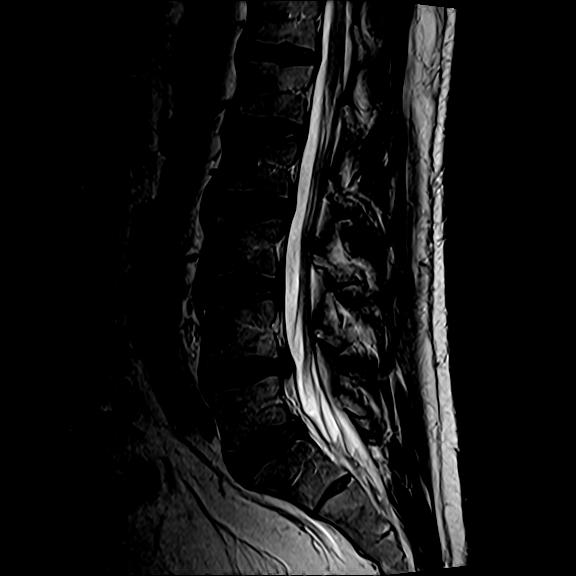
[im 11/16]
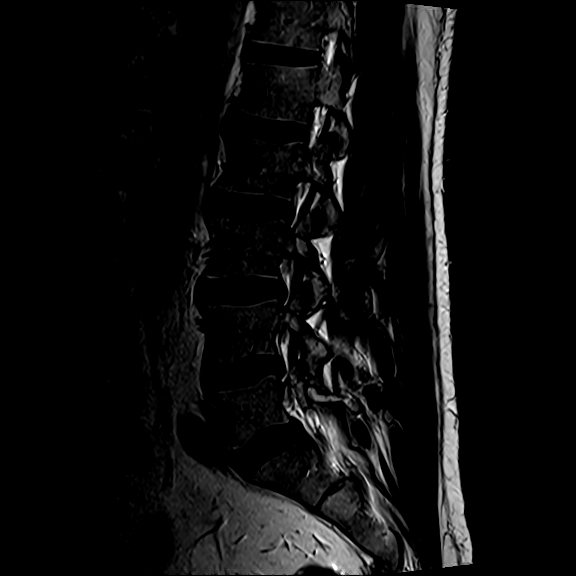
[im 13/16]
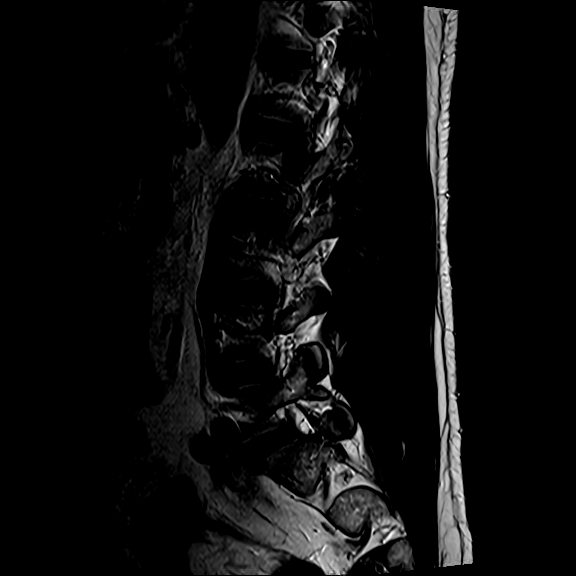
[im 16/16]
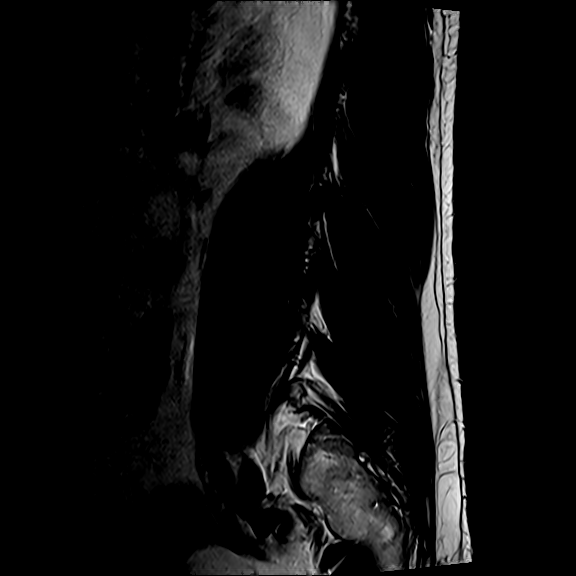

[Series 17: t1_sag · sagittal · 4.0mm · 0.45mm/px · 3 of 16 slices shown]
[im 3/16]
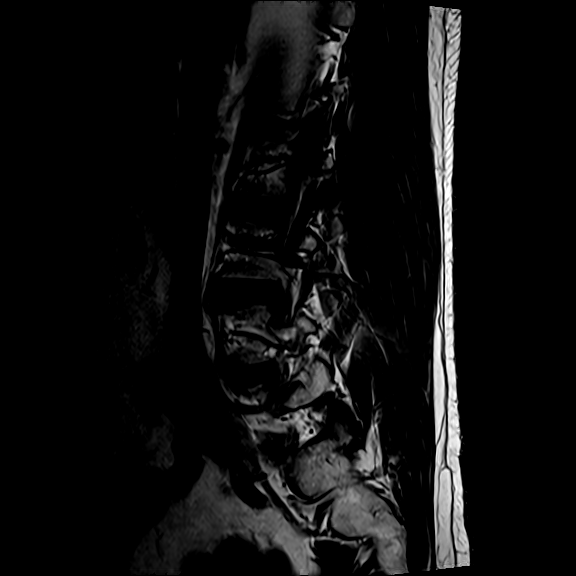
[im 8/16]
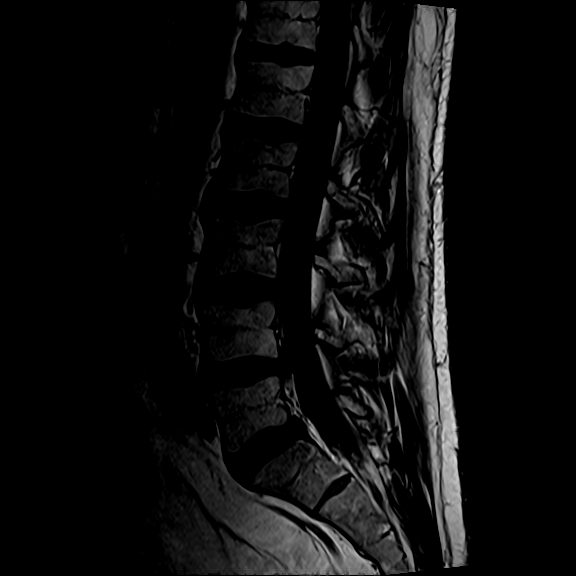
[im 13/16]
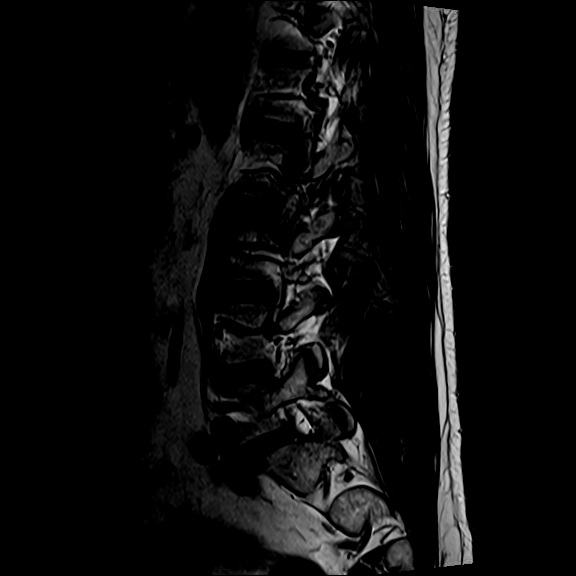

[Series 18: ir_sag · sagittal · 4.0mm · 0.51mm/px · 3 of 16 slices shown]
[im 3/16]
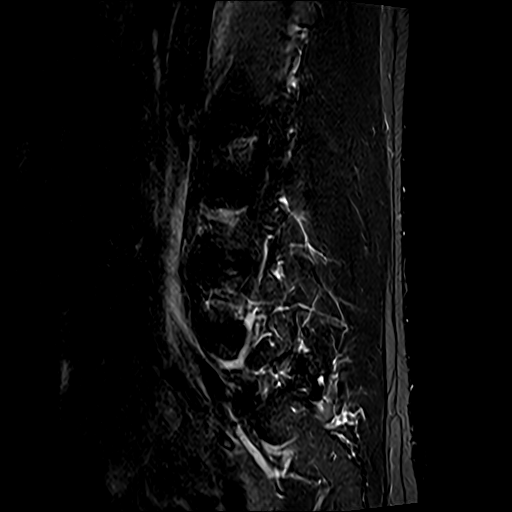
[im 8/16]
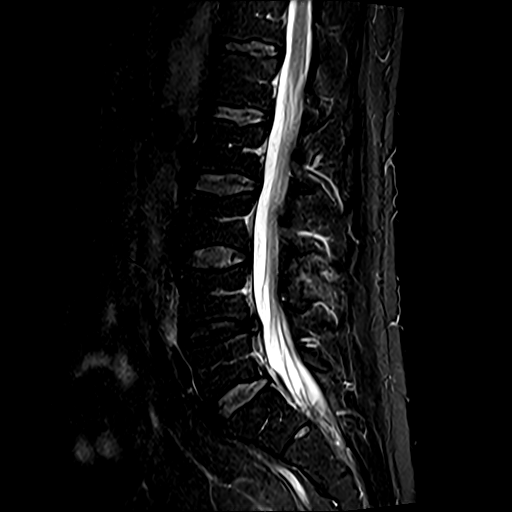
[im 13/16]
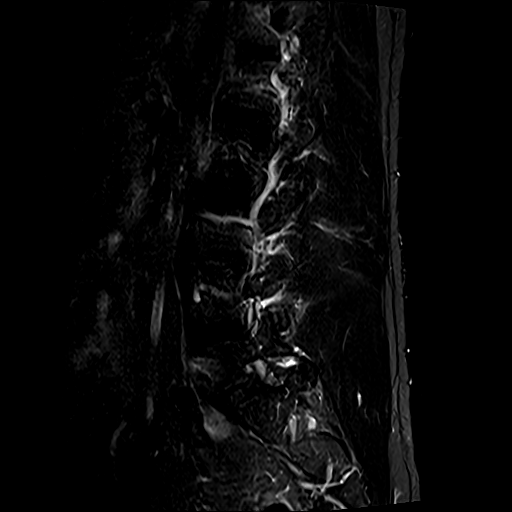

[Series 19: t2_axial · axial · 4.0mm · 0.33mm/px · z∈[-529,-390]mm · 3 of 38 slices shown]
[im 5/38]
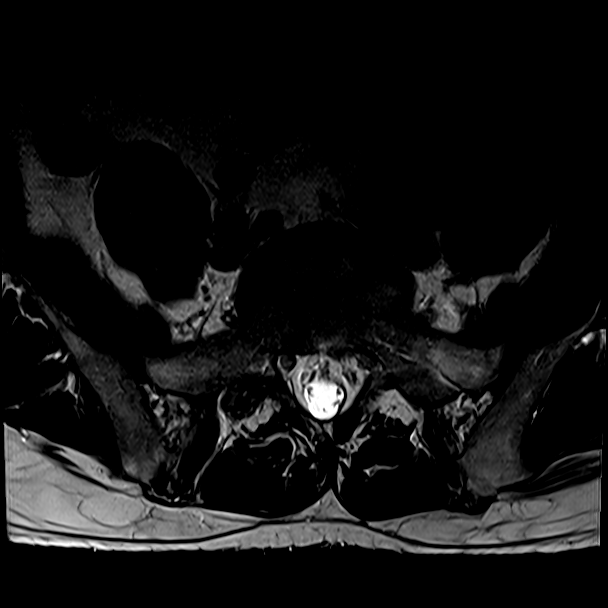
[im 20/38]
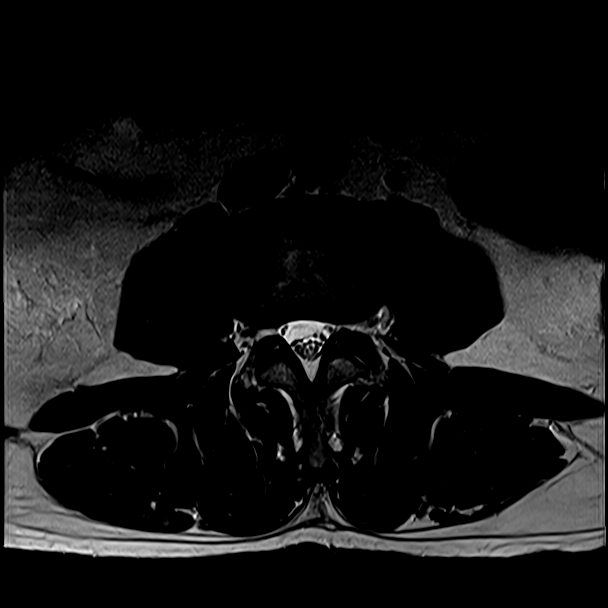
[im 33/38]
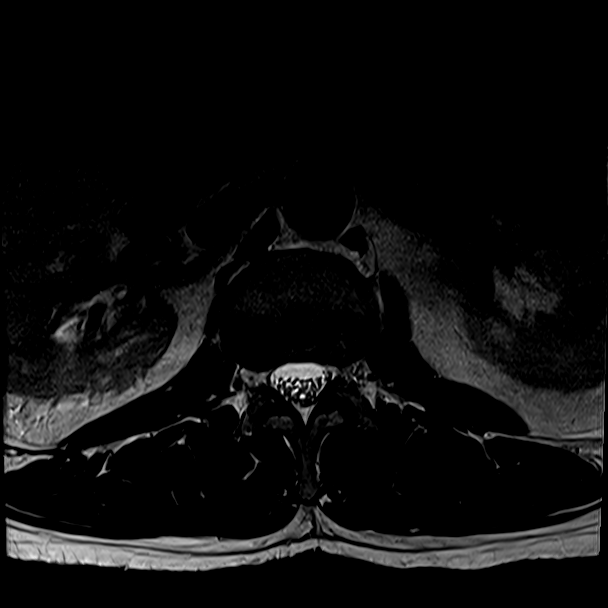

[16 of 48 positions shown; findings below may reference images not displayed]

FINDINGS: Vertebral body height and alignment: Within normal limits.
Degenerative disc changes: No significant degenerative disc disease is present.
Conus and cauda equina: Within normal limits.
Marrow signal: The marrow signal is unremarkable.
L5-S1: Facet arthrosis. Patent spinal canal. Moderate right foraminal stenosis. Left foramen is patent.
L4-L5: Broad-based disc protrusion. Mild spinal canal stenosis and with anterior thecal sac deformity. Both foramina are patent.
L3-L4: No disc protrusion, canal stenosis, or foraminal stenosis.
L2-L3: No disc protrusion, canal stenosis, or foraminal stenosis.
L1-L2: No disc protrusion, canal stenosis, or foraminal stenosis.
T12-L1: No disc protrusion, canal stenosis, or foraminal stenosis.
Visualized SI joints: Intact.
Visualized soft tissues: Unremarkable
IMPRESSION: 1.  Moderate right L5-S1 foraminal stenosis.
2.  Mild L4-5 spinal canal stenosis.

## 2022-10-17 ENCOUNTER — Encounter: Admit: 2022-10-17 | Payer: MEDICAID | Attending: Orthopedic Surgery | Primary: Internal Medicine

## 2022-10-22 ENCOUNTER — Ambulatory Visit: Admit: 2022-10-22 | Payer: MEDICAID | Attending: Pain Medicine | Primary: Internal Medicine

## 2022-10-23 ENCOUNTER — Telehealth: Admit: 2022-10-23 | Payer: PRIVATE HEALTH INSURANCE | Attending: Orthopedic Surgery | Primary: Internal Medicine

## 2022-10-23 ENCOUNTER — Encounter: Admit: 2022-10-23 | Payer: PRIVATE HEALTH INSURANCE | Primary: Internal Medicine

## 2022-10-23 NOTE — Telephone Encounter
Pt called, requesting a f/up with Flossie Dibble, due to the increased pain in pt's left shoulder.  Pt states he had an injection 10/01/22, and his shoulder felt better until approx 2 days ago.  Pt not sure if it is too soon for another injection.  Next available f/up slot, I see isn't until week of 11/11/22, pt would rather not wait that long.Please advise, and thank youSharon

## 2022-10-28 ENCOUNTER — Encounter: Admit: 2022-10-28 | Payer: MEDICAID | Attending: Orthopedic Surgery | Primary: Internal Medicine

## 2022-10-29 NOTE — Telephone Encounter
Pt has been scheduled for an appt on 1/29 at 10:30 am with Derrill Center you

## 2022-10-30 NOTE — Telephone Encounter
You're Welcome

## 2022-11-05 ENCOUNTER — Encounter: Admit: 2022-11-05 | Payer: PRIVATE HEALTH INSURANCE | Attending: Pain Medicine | Primary: Internal Medicine

## 2022-11-05 ENCOUNTER — Ambulatory Visit: Admit: 2022-11-05 | Payer: MEDICAID | Attending: Pain Medicine | Primary: Internal Medicine

## 2022-11-05 DIAGNOSIS — G8929 Other chronic pain: Secondary | ICD-10-CM

## 2022-11-05 DIAGNOSIS — M546 Pain in thoracic spine: Secondary | ICD-10-CM

## 2022-11-05 DIAGNOSIS — T8859XA Other complications of anesthesia, initial encounter: Secondary | ICD-10-CM

## 2022-11-05 DIAGNOSIS — R200 Anesthesia of skin: Secondary | ICD-10-CM

## 2022-11-05 DIAGNOSIS — E7800 Pure hypercholesterolemia, unspecified: Secondary | ICD-10-CM

## 2022-11-05 DIAGNOSIS — L905 Scar conditions and fibrosis of skin: Secondary | ICD-10-CM

## 2022-11-05 NOTE — Progress Notes
Review of Systems Musculoskeletal: Positive for back pain.      Left shoulder upper back pain Neurological:      Denies N/T and weakness All other systems reviewed and are negative.Reviewed post-procedure education with patient. Also provided written education on AVS. Patient verbalized understanding and will contact our office with any further questions.

## 2022-11-05 NOTE — Progress Notes
PROCEDURE NOTE: Date of procedure: 01/16/24Procedure- Trigger point (low dose toradol) injection to left periscapular muscles via ultrasound guidanceAttending Physician:  Marny Lowenstein, DOPre-Procedure Diagnosis:  Myofascial pain syndrome [M79.1]Postprocedure diagnosis: Same Comments:  He continues complaining significant pain to very concentrated area at left medial scapula at around T6-7.  No pain any other places.  This is significantly affecting his left shoulder.  He recently received a left shoulder injection 1 month ago which did not help.Time Out performed:  Correct patient identity, procedure to be performed and as applicable, correct side and site, correct patient position.  All contraindications were inquired and ruled out.   After review of risks and benefits as well as rationale of the procedure, Patient desired to proceed.  The risks were reviewed including but not limited to infection, bleeding, hematoma formation, neuroma, neuritis, skin dimpling, hyperpigmentation, elevated blood glucose levels, pneumothorax and steroid flare. Technique: Patient was placed in position:    []  Sitting	[x]  Prone	 []  Supine. The procedure was carried out under sterile technique using Chloroprep/alcohol swabs.  The area of injection was verified using manual palpation and maximal tenderness.  All injections were done with guidance of ultrasound under sterile conditions.3 areas of maximal tenderness was identified and was injected via ultrasound-1. Left middle trapezius/left rhomboids (medial to scapula at T5-7)2. Left middle trapezius/left rhomboid (medial to scapula at T6-7)3. Left T7-8 intercostal muscles (medial to scapula)  Injectate consisted of:[x]  0.30mL Toradol (30 milligram/mL) [x]  5mL 1% Lidocaine (PF) without epinephrine[x]  5mL 0.25% Bupivacaine (PF) without epinephrineUsing a 25 gauge 2 inch needle, the area was aspirated of 0 mL fluid, and each above area was injected with portion of the injectate mentioned above after dry needling all 4 quadrants.Patient tolerated this injection well.  A band-aid dressing was applied.  Post injection instructions were given including applying a cool pack to the area of injection for 20 minutes on and 20 minutes off upon returning home and again tonight. I have advised patient to continue stretching and strengthening program. Steroid lot number [id number] / Expiration 06/2025Robin Margaretann Loveless, DOInterventional Spine and Musculoskeletal MedicinePhysical Medicine and RehabilitationYale Department of Orthopedics and Rehabilitation

## 2022-11-05 NOTE — Patient Instructions
Procedure Discharge InstructionsYou had  Trigger point injections.Do not do any strenuous activity today.You may resume your normal diet.You may return to work on: TodayDo not shower or use any warm-hot water over the area covered by the procedure for 8-12 hours. No bath or hot tubs for 48 hours after the procedure.On occasion you can experience an increase in pain for up to 72 hours following the procedure. We recommend that you put ice on the area for the first 24 hours if needed ( 10-15 minutes at a time, 3 times a day, morning, afternoon, evening). After 24 hours you may use moist heat or ice if needed. Call the [facility] physiatry team if you have any additional questions or concerns. Please monitor the site for bleeding, signs of infections, increased swelling.Please report any new or increasing numbness in your arms or legs.

## 2022-11-06 DIAGNOSIS — M7918 Myalgia, other site: Secondary | ICD-10-CM

## 2022-11-11 ENCOUNTER — Telehealth: Admit: 2022-11-11 | Payer: PRIVATE HEALTH INSURANCE | Attending: Pain Medicine | Primary: Internal Medicine

## 2022-11-11 ENCOUNTER — Encounter: Admit: 2022-11-11 | Payer: MEDICAID | Attending: Pain Medicine | Primary: Internal Medicine

## 2022-11-11 NOTE — Telephone Encounter
Pt is requesting a call back. Pt declined any additional information

## 2022-11-12 ENCOUNTER — Encounter: Admit: 2022-11-12 | Payer: PRIVATE HEALTH INSURANCE | Attending: Orthopedic Surgery | Primary: Internal Medicine

## 2022-11-12 ENCOUNTER — Encounter: Admit: 2022-11-12 | Payer: MEDICAID | Attending: Orthopedic Surgery | Primary: Internal Medicine

## 2022-11-12 DIAGNOSIS — E7800 Pure hypercholesterolemia, unspecified: Secondary | ICD-10-CM

## 2022-11-12 DIAGNOSIS — R200 Anesthesia of skin: Secondary | ICD-10-CM

## 2022-11-12 DIAGNOSIS — T8859XA Other complications of anesthesia, initial encounter: Secondary | ICD-10-CM

## 2022-11-12 DIAGNOSIS — L905 Scar conditions and fibrosis of skin: Secondary | ICD-10-CM

## 2022-11-12 DIAGNOSIS — M67814 Other specified disorders of tendon, left shoulder: Secondary | ICD-10-CM

## 2022-11-12 NOTE — Progress Notes
Patient name: Aaron Irwin: [id number] Date of birth: November 19, 1972Date of visit: 1/23/2024Provider: Francena Hanly, APRNCC:  Continued left shoulder pain since last visit.HPI: 52 y.o. right hand dominant male presents for follow-up of his left shoulder.The patient states that the CSI he received did not improve his symptoms.   He continues to have pain to the left shoulder, into his upper trapezius muscle. He also has intermittent forearm pain.The patient states he is still in formal PT, but isn't experiencing improvement of his symptoms.He denies swelling and numbness/tingling.Past Medical History:  has a past medical history of Anesthesia, Anesthesia complication (01/19/2020), Hypercholesteremia, and Scar.Past Surgical History:  has a past surgical history that includes Knee Arthrocentesis and Inguinal hernia repair (01/19/2020).Family History: family history includes Hypertension in his mother.Social History was reviewed and is in EPIC chart.Allergies: Patient has no known allergies.Medications: has a current medication list which includes the following prescription(s): atorvastatin, multivitamin, and meloxicam.Review of Systems: I have reviewed the review of systems and it is listed in the clinical support section of the record.Occupation:  Systems administratorPhysical Exam: Vital signs: Ht 6' 1 (1.854 m)  - Wt 99.8 kg  - BMI 29.03 kg/m? Constitutional: The patient is alert, interactive, well-nourished and developed, and in no acute distress.Neurologic: Alert and oriented x4 with a non-antalgic gait.Psychologic: Demonstrates appropriate interactions, mood, and affect.Head: The patient?s head is proportional in size to their body and their face is symmetric.Neck: Midline positioning and full AROM without pain.Skin: The extremities are without evidence of rash, erythema, or trophic changes.Lymph: No generalized lymphedema to the extremities. Cardiovascular: Palpable radial pulses. The extremities are warm and well perfused.Left shoulder:	Scars: NoMuscular atrophy: Yes	Tenderness to palpation: Yes of the upper trapezius	Range of motion: 		Full AROM	Contralateral shoulder shows no muscular atrophy, full active and passive ROM, and no rotator cuff weakness.Imaging: MRI from 09/26/2022 of the left shoulder demonstrates evidence of mild supraspinatus and infraspinatus tendinosis.  There is also evidence of os acromiale.Assessment: 52 y.o. 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 I do think the patient would benefit from trigger point injections to the upper trapezius. I also discussed with the patient that I can't connect his forearm pain to his right shoulder, specifically his mild rotator cuff tendinosis, so I would caution him to think the forearm pain would improve with intervention to the left shoulder.I will reach out to Dr. Margaretann Loveless about PRP/trigger point injections to get his opinion. All of the patient's questions have been answered and they are in agreement with this treatment plan.The patient is to follow-up PRN.[name], MSN, APRN, FNP-BCDepartment of Orthopaedics & Rehabilitation - Sports MedicineYale [facility] of Medicine

## 2022-11-12 NOTE — Telephone Encounter
Returned call and spoke with patient.  Patient is s/p procedure 11/05/22 TPI (low dose toradol) injection to left periscapular muscles via [place].   Patient stated that he had a f/u with Dr Faythe Casa and that she would consult with Dr Margaretann Loveless.  Patient inquired if both doctors were able to have discussion about PRP and more TPI.  This Clinical research associate stated that she would relay message to provider.  Patient verbalized understanding with no further questions or concerns.

## 2022-11-13 NOTE — Telephone Encounter
Patient cb about this to speak with an Charity fundraiser, thanks.

## 2022-11-13 NOTE — Telephone Encounter
Please schedule him for telemedicine or in-office follow-up visit in the next 1 to 3 weeks to discuss further.  Please place him on cancellation list as well.  Thanks

## 2022-11-13 NOTE — Telephone Encounter
Pt calling in to schedule sooner appt with Dr. Raju - according to Margaretann LovelessE I do not see anything sooner in [facility] in the next 1-3 weeks. Please advise on scheduling. Thank you.

## 2022-11-14 NOTE — Telephone Encounter
Called patient and scheduled in person f/u in [place] for 11/20/22 at 8:45am.

## 2022-11-14 NOTE — Telephone Encounter
Patient is calling back to discuss his concerns, he's in pain-neck/shoulder area, waiting for the resolution to [name] Valle's Betsy Prieswith Dr. Margaretann Loveless.  Patient is requesting a call back at [phone] to discuss further. Thank you.

## 2022-11-18 ENCOUNTER — Telehealth: Admit: 2022-11-18 | Payer: PRIVATE HEALTH INSURANCE | Attending: Pain Medicine | Primary: Internal Medicine

## 2022-11-18 ENCOUNTER — Encounter: Admit: 2022-11-18 | Payer: MEDICAID | Attending: Pain Medicine | Primary: Internal Medicine

## 2022-11-18 ENCOUNTER — Encounter: Admit: 2022-11-18 | Payer: MEDICAID | Attending: Orthopedic Surgery | Primary: Internal Medicine

## 2022-11-18 DIAGNOSIS — M7918 Myalgia, other site: Secondary | ICD-10-CM

## 2022-11-18 DIAGNOSIS — M546 Pain in thoracic spine: Secondary | ICD-10-CM

## 2022-11-18 DIAGNOSIS — M67912 Unspecified disorder of synovium and tendon, left shoulder: Secondary | ICD-10-CM

## 2022-11-18 DIAGNOSIS — M47812 Spondylosis without myelopathy or radiculopathy, cervical region: Secondary | ICD-10-CM

## 2022-11-18 MED ORDER — BUPIVACAINE (PF) 0.25 % (2.5 MG/ML) INJECTION SOLUTION
0.252.5 % (2.5 mg/mL) | Freq: Once | INTRAMUSCULAR | Status: DC
Start: 2022-11-18 — End: 2022-11-19

## 2022-11-18 MED ORDER — KETOROLAC 30 MG/ML (1 ML) INJECTION SOLUTION
301 mg/mL (1 mL) | Freq: Once | INTRAMUSCULAR | Status: DC
Start: 2022-11-18 — End: 2022-11-19

## 2022-11-18 MED ORDER — LIDOCAINE (PF) 10 MG/ML (1 %) INJECTION SOLUTION
101 mg/mL (1 %) | Freq: Once | INTRAMUSCULAR | Status: DC
Start: 2022-11-18 — End: 2022-11-19

## 2022-11-18 NOTE — Telephone Encounter
Antibiotics, infection or illness within past two weeks? NoVaccines within past two weeks including flu or COVID or COVID Booster? No	Completed COVID vaccine? Yes Date of completion: n/a		Patient should bring vaccination card to appointment if not performed within [place]. If patient is not vaccinated or partially vaccinated, please make sure a COVID test order is place by a provider.	Notify patient no vaccines should be received between pre-procedure call and procedure date: confirmedSurgery or major dental work within past six weeks? NoPast reaction/allergy to contrast dye? No	Reaction:	Requires pre-treatment per protocol: N/A	* Severe Reaction to contrast media (ie shellfish, topical iodine, peanuts) may require pre medication. Contact providerTaking blood thinners (ask about Plavix, Aspirin or any anticoagulant)? No 	Drug:	Anticipated last dose:	Prescribing MD:	Date Cardiac Letter Sent:	Cervical (except TPI), interlaminar or caudal ESI need to hold ASA x 7 days 	and NSAIDS x 24 hours.Cardiac defibrillator or pacemaker? No If yes, cannot perform [facility]. Any chance of pregnancy? N/ATransportation to/from appointment? N/A	All procedures (except bedside procedures) require transportation.	If patient does not have transportation and requires it, need to reschedule.Patient to contact office with any further questions or concerns prior to this appointment.Patient advised to contact our office should there be any changes to health (illness, antibiotics, etc.) prior to this procedure. Confirmed appointment date and time.

## 2022-11-18 NOTE — Telephone Encounter
Patient requesting a sooner appointment for his procedure on 2/20-said he's in excruciating pain. Please advise if there is a sooner appointment, patient can be reached at [phone]. Thank you.

## 2022-11-19 ENCOUNTER — Ambulatory Visit: Admit: 2022-11-19 | Payer: MEDICAID | Attending: Pain Medicine | Primary: Internal Medicine

## 2022-11-19 ENCOUNTER — Encounter: Admit: 2022-11-19 | Payer: PRIVATE HEALTH INSURANCE | Attending: Pain Medicine | Primary: Internal Medicine

## 2022-11-19 ENCOUNTER — Telehealth: Admit: 2022-11-19 | Payer: PRIVATE HEALTH INSURANCE | Attending: Pain Medicine | Primary: Internal Medicine

## 2022-11-19 ENCOUNTER — Encounter: Admit: 2022-11-19 | Payer: PRIVATE HEALTH INSURANCE | Attending: Internal Medicine | Primary: Internal Medicine

## 2022-11-19 DIAGNOSIS — R200 Anesthesia of skin: Secondary | ICD-10-CM

## 2022-11-19 DIAGNOSIS — E7800 Pure hypercholesterolemia, unspecified: Secondary | ICD-10-CM

## 2022-11-19 DIAGNOSIS — D170 Benign lipomatous neoplasm of skin and subcutaneous tissue of head, face and neck: Secondary | ICD-10-CM

## 2022-11-19 DIAGNOSIS — L905 Scar conditions and fibrosis of skin: Secondary | ICD-10-CM

## 2022-11-19 DIAGNOSIS — M67912 Unspecified disorder of synovium and tendon, left shoulder: Secondary | ICD-10-CM

## 2022-11-19 DIAGNOSIS — M47812 Spondylosis without myelopathy or radiculopathy, cervical region: Secondary | ICD-10-CM

## 2022-11-19 DIAGNOSIS — G8929 Other chronic pain: Secondary | ICD-10-CM

## 2022-11-19 DIAGNOSIS — M546 Pain in thoracic spine: Secondary | ICD-10-CM

## 2022-11-19 DIAGNOSIS — M7918 Myalgia, other site: Secondary | ICD-10-CM

## 2022-11-19 DIAGNOSIS — T8859XA Other complications of anesthesia, initial encounter: Secondary | ICD-10-CM

## 2022-11-19 NOTE — Patient Instructions
Procedure Discharge InstructionsYou had  Trigger point injections.Do not do any strenuous activity today.You may resume your normal diet.You may return to work on: TodayDo not shower or use any warm-hot water over the area covered by the procedure for 8-12 hours. No bath or hot tubs for 48 hours after the procedure.On occasion you can experience an increase in pain for up to 72 hours following the procedure. We recommend that you put ice on the area for the first 24 hours if needed ( 10-15 minutes at a time, 3 times a day, morning, afternoon, evening). After 24 hours you may use moist heat or ice if needed. Call the [facility] physiatry team if you have any additional questions or concerns. Please monitor the site for bleeding, signs of infections, increased swelling.Please report any new or increasing numbness in your arms or legs.

## 2022-11-19 NOTE — Telephone Encounter
Patient is calling and states Dr. Margaretann Loveless placed a referral incorrectly for his physical therapy.  When he called PT to schedule, he was told the referral has to state physical therapy, occupational therapy and rehabilitation therapy.  Please advise and call the patient to confirm the change is all set.

## 2022-11-19 NOTE — Progress Notes
Review of Systems Musculoskeletal:      Left shoulder pain Neurological:      Denies N/T and weakness All other systems reviewed and are negative.Reviewed post-procedure education with patient. Also provided written education on AVS. Patient verbalized understanding and will contact our office with any further questions.
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
## 2022-11-19 NOTE — Progress Notes
PROCEDURE NOTE: Date of procedure: 01/30/24Procedure- Trigger point (low dose toradol) injection to left upper trapezius & cervical paraspinalsAttending Physician:  Marny Lowenstein, DOPre-Procedure Diagnosis:  Myofascial pain syndrome [M79.1]Postprocedure diagnosis: Same Comments:  He continues complaining significant pain to left upper trap and left neck region.  No significant radiation of the pain.  He has a lymph node on the left posterior neck.  No contraindications.  Does not like current physical therapy.  He is requesting physical therapy at [facility].Time Out performed:  Correct patient identity, procedure to be performed and as applicable, correct side and site, correct patient position.  All contraindications were inquired and ruled out.  After review of risks and benefits as well as rationale of the procedure, Patient desired to proceed.  The risks were reviewed including but not limited to infection, bleeding, hematoma formation, neuroma, neuritis, pneumothorax and flare. Technique: Patient was placed in position:    [x]  Sitting []  Prone  []  Supine. The procedure was carried out under sterile technique using [x]  Chloroprep  []  Alcohol swabs. The area of injection was verified using manual palpation and maximal tenderness.3 areas of maximal tenderness was identified and was injected-1. Left upper trapezius (above spine of scapula) 2. Left middle trapezius (medial to scapula at T1-2)3. Left cervical paraspinals (at level of C5-6-all precautions were made to avoid the lymph node)  Injectate consisted of:[x]  1ml  Toradol (30 milligram/ml) [x]  5mL 1% Lidocaine (PF) without epinephrine[x]  5mL 0.25% bupivacaine (PF) without epinephrineUsing a 25 gauge 2 inch needle, the area was aspirated of 0 mL fluid, and each above area was injected with portion of the injectate mentioned above after dry needling all 4 quadrants.  Needle was kept shallow to avoid pneumothorax.Patient tolerated this injection well.  A band-aid dressing was applied.  Post injection instructions were given including applying a cool pack to the area of injection for 20 minutes on and 20 minutes off upon returning home and again tonight. I have advised patient to continue stretching and strengthening program.  Ordered physical therapy.Lot number [id number] / Expiration 06/2025Robin Margaretann Loveless, DOInterventional Spine and Musculoskeletal MedicinePhysical Medicine and RehabilitationYale Department of Orthopedics and Rehabilitation

## 2022-11-20 ENCOUNTER — Encounter: Admit: 2022-11-20 | Payer: MEDICAID | Attending: Pain Medicine | Primary: Internal Medicine

## 2022-11-20 NOTE — Telephone Encounter
Spoke with Desiree at front desk and she verified that referral is written correctly and will contact patient to schedule.

## 2022-11-20 NOTE — Telephone Encounter
PT called for update on new referral.  Pt needs current referral to be revised to say physical therapy, occupational therapy and rehabilitation therapy.

## 2022-11-20 NOTE — Telephone Encounter
Patient is calling and states Dr. Raju placed a referral incorrectly for his physical therapy.  When he called PT to schedule, he was told the referral has to state physical therapy, occupational therapy and rehabilitation therapy.  Please advise and call the patient to confirm the change is all set.

## 2022-11-20 NOTE — Telephone Encounter
RTC and LVM for patient to call [facility] to discuss referral.

## 2022-11-22 ENCOUNTER — Inpatient Hospital Stay: Admit: 2022-11-22 | Discharge: 2022-11-22 | Payer: MEDICAID | Primary: Internal Medicine
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
## 2022-11-25 ENCOUNTER — Inpatient Hospital Stay: Admit: 2022-11-25 | Discharge: 2022-11-25 | Payer: MEDICAID | Primary: Internal Medicine
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
## 2022-11-26 ENCOUNTER — Encounter: Admit: 2022-11-26 | Payer: PRIVATE HEALTH INSURANCE | Attending: Internal Medicine | Primary: Internal Medicine

## 2022-11-26 DIAGNOSIS — M25561 Pain in right knee: Secondary | ICD-10-CM

## 2022-11-26 DIAGNOSIS — Z9889 Other specified postprocedural states: Secondary | ICD-10-CM

## 2022-11-26 DIAGNOSIS — Z87828 Personal history of other (healed) physical injury and trauma: Secondary | ICD-10-CM

## 2022-11-27 ENCOUNTER — Inpatient Hospital Stay: Admit: 2022-11-27 | Discharge: 2022-11-27 | Payer: MEDICAID | Primary: Internal Medicine
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
## 2022-11-29 ENCOUNTER — Ambulatory Visit: Admit: 2022-11-29 | Payer: MEDICAID | Primary: Internal Medicine

## 2022-11-29 ENCOUNTER — Inpatient Hospital Stay: Admit: 2022-11-29 | Discharge: 2022-11-29 | Payer: MEDICAID | Primary: Internal Medicine

## 2022-11-29 ENCOUNTER — Encounter: Admit: 2022-11-29 | Payer: PRIVATE HEALTH INSURANCE | Attending: Internal Medicine | Primary: Internal Medicine

## 2022-11-29 DIAGNOSIS — Z021 Encounter for pre-employment examination: Secondary | ICD-10-CM

## 2022-11-29 DIAGNOSIS — D170 Benign lipomatous neoplasm of skin and subcutaneous tissue of head, face and neck: Secondary | ICD-10-CM

## 2022-11-29 LAB — MUMPS ANTIBODY, IGG
BKR MUMPS IGG ANTIBODY: POSITIVE Index
BKR MUMPS IGG INITIAL RESULT: 89.3 AU/mL

## 2022-11-29 LAB — HEPATITIS B SURFACE ANTIBODY     (BH GH L LMW YH): BKR HEP B SURFACE AB INITIAL RESULT: <8 m[IU]/mL (ref >=12)

## 2022-11-29 LAB — MEASLES (RUBEOLA) ANTIBODY, IGG
BKR MEASLES IGG INITIAL RESULT: 70.7 AU/mL
BKR MEASLES IGG: POSITIVE

## 2022-11-29 LAB — VARICELLA ZOSTER ANTIBODY, IGG
BKR VARICELLA-ZOSTER ANTIBODY, IGG: POSITIVE
BKR VZV IGG INITIAL RESULT: 3337 IV

## 2022-11-29 LAB — RUBELLA ANTIBODY, IGG     (BH GH L LMW YH)
BKR RUBELLA ANTIBODY, IGG: POSITIVE
BKR RUBELLA IGG INITIAL RESULT: 1.87 {index}

## 2022-12-02 ENCOUNTER — Inpatient Hospital Stay
Admit: 2022-12-02 | Discharge: 2022-12-02 | Payer: MEDICAID | Attending: Rehabilitative and Restorative Service Providers" | Primary: Internal Medicine

## 2022-12-02 ENCOUNTER — Inpatient Hospital Stay: Admit: 2022-12-02 | Discharge: 2022-12-02 | Payer: MEDICAID | Primary: Internal Medicine

## 2022-12-02 DIAGNOSIS — M25561 Pain in right knee: Secondary | ICD-10-CM

## 2022-12-02 NOTE — Other
REHABILITATION SERVICES AT [facility] At [phone] [facility] [address]  [address]GN Number: [phone] Number: [phone] Therapy Daily NotePatient Name:  Aaron Irwin Record Number:  [id number] Date of Birth:  1972/12/27Therapist:  Athena Masse, PTReferring Provider:  Glennie Hawk, PAICD-10 Diagnosis(es):Problem List         ICD-10-CM   PT-Right Knee Pain  * (Principal) Right knee pain M25.561 General InformationTherapy Episode of Care   Date of Visit:  12/02/2022    Treatment Number:  2 Medication Review:Current Outpatient Medications Medication Sig  atorvastatin 1 tablet  meloxicam Take 1 tablet (7.5 mg total) by mouth daily. (Patient not taking: Reported on 10/01/2022)  multivitamin Take 1 capsule by mouth daily. SubjectiveFeeling a little better. Can I bike.ObjectiveOutcomes Measure not required for this visit                                   Treatment Provided This VisitCPT Code Interventions Timed Minutes Untimed Minutes Total Minutes N/A     Ultrasound (47425) 1.5 w/cm2 anterior knee /patella tendon 8  8 Therapeutic Exercise (97110) ROM stretch ITB,patella mob,HS stretch, prone knee flexion and hip stretch.Piriformis stretch. Ktape patella 15  15 Heat - Ice Pack  Posterior knee during [place]  10 10   Total Treatment Time: 33 AssessmentGood early progress.Begin gradual strengtheningPlanFrequency:  2x per weekPlan for Next VisitRStrengthening.Continue stretching
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
## 2022-12-04 ENCOUNTER — Encounter: Admit: 2022-12-04 | Payer: PRIVATE HEALTH INSURANCE | Attending: Internal Medicine | Primary: Internal Medicine

## 2022-12-04 ENCOUNTER — Inpatient Hospital Stay: Admit: 2022-12-04 | Discharge: 2022-12-04 | Payer: MEDICAID | Primary: Internal Medicine

## 2022-12-04 ENCOUNTER — Telehealth: Admit: 2022-12-04 | Payer: PRIVATE HEALTH INSURANCE | Attending: Pain Medicine | Primary: Internal Medicine

## 2022-12-04 DIAGNOSIS — M25561 Pain in right knee: Secondary | ICD-10-CM

## 2022-12-04 DIAGNOSIS — Z87828 Personal history of other (healed) physical injury and trauma: Secondary | ICD-10-CM

## 2022-12-04 DIAGNOSIS — D170 Benign lipomatous neoplasm of skin and subcutaneous tissue of head, face and neck: Secondary | ICD-10-CM

## 2022-12-04 DIAGNOSIS — Z9889 Other specified postprocedural states: Secondary | ICD-10-CM

## 2022-12-04 NOTE — Other
REHABILITATION SERVICES AT [facility] Services At [phone] [facility] [address] [address] Number: [phone] Number: [phone] Therapy Daily NotePatient Name:  Aaron Irwin Record Number:  [id number] Date of Birth:  Mar 16, 1972Therapist:  Vivien Presto, PT Referring Provider:  Glennie Hawk, PAICD-10 Diagnosis(es):Problem List         ICD-10-CM   PT-Right Knee Pain  Right knee pain M25.561 General InformationTherapy Episode of Care   Date of Visit:  12/02/2022    Treatment Number:  2 Precautions/Limitations   Precautions/Limitations:  Standard precautionsMedication Review:Current Outpatient Medications Medication Sig  atorvastatin 1 tablet  meloxicam Take 1 tablet (7.5 mg total) by mouth daily. (Patient not taking: Reported on 10/01/2022)  multivitamin Take 1 capsule by mouth daily. Subjective  Would it be okay to use ankle weights when I walk?  I also have some pain like this on my left knee at times.  I was talking with my doctor about a referral but no as I also do therapy for my shoulder and these could not be mixed that those probably could not be mixed.ObjectiveOutcomes Measure not required for this visit                                   Treatment Provided This VisitCPT Code Interventions Timed Minutes Untimed Minutes Total Minutes N/A     Manual Therapy (97140) DFM to patella tendon with R knee flexed over bolster, medial and lateral sidesKinesiotaping patellar space correction 15   Therapeutic Exercise (97110) TG height 20 eccentric lower R squat, concentric phase with both 3x10LAQ 3x10 5lb ankle weight, and add to daily HEP with following instructions: use a 5 pound ankle weight. after 3 days, if no increased pain, may increase weight by 1 pound at a time every 3 days up to 10 pounds.Prone quad stretch 3x30 holds R  I advised the patient to do his home exercise program on both legs in hopes of addressing the pain he is having occasionally on the left knee.  I discussed with him that if this did not help he could have PT following the episode of care for the right knee for his left knee.   I also advised him that it is okay to use ankle weights for his walks as long as it does not increase his knee pain. 20   Heat - Ice Pack        Total Treatment Time: 35 Access Code: [email or web address]: [email or web address]: 02/14/2024Prepared by: Consuella Lose HORNExercises- Hooklying Active Hamstring Stretch  - 1-2 x daily - 7 x weekly - 1-2 sets - 10 reps- Supine ITB Stretch with Strap  - 1-2 x daily - 7 x weekly - 1-2 sets - 10 reps- Standing ITB Stretch  - 1-2 x daily - 7 x weekly - 1-2 sets - 10 reps- Supine Piriformis Stretch with Leg Straight  - 1-2 x daily - 7 x weekly - 1-2 sets - 10 reps- Prone Quadriceps Stretch with Strap  - 1-2 x daily - 7 x weekly - 1-2 sets - 10 reps - 5-10 hold- Seated Knee Extension AROM  - 1 x daily - 7 x weekly - 3 sets - 10 repsAssessmentPatient demonstrates improvements in his quadriceps flexibility as well as patella mobility.  Addition of LA Q and Total gym was tolerated well without complaints of pain.PlanFrequency:  2x per weekPlan for Next Visit  Gradual progression of strengthening with closed-chain activities incorporated as patient hopes to return to  activities like basketball, jogging eventually.

## 2022-12-04 NOTE — Telephone Encounter
This patient called stated that he doesn not need a follow up appt before next procedure.  Please confirm that this is the case.  If so please place the order for the next TPI
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
## 2022-12-06 ENCOUNTER — Ambulatory Visit: Admit: 2022-12-06 | Payer: MEDICAID | Primary: Internal Medicine

## 2022-12-09 ENCOUNTER — Inpatient Hospital Stay: Admit: 2022-12-09 | Discharge: 2022-12-09 | Payer: MEDICAID | Primary: Internal Medicine

## 2022-12-09 ENCOUNTER — Encounter: Admit: 2022-12-09 | Payer: PRIVATE HEALTH INSURANCE | Primary: Internal Medicine

## 2022-12-09 ENCOUNTER — Encounter: Admit: 2022-12-09 | Payer: MEDICAID | Attending: Pain Medicine | Primary: Internal Medicine

## 2022-12-09 ENCOUNTER — Ambulatory Visit: Admit: 2022-12-09 | Payer: MEDICAID | Primary: Internal Medicine

## 2022-12-09 DIAGNOSIS — M7918 Myalgia, other site: Secondary | ICD-10-CM

## 2022-12-09 DIAGNOSIS — M47812 Spondylosis without myelopathy or radiculopathy, cervical region: Secondary | ICD-10-CM

## 2022-12-09 DIAGNOSIS — M67912 Unspecified disorder of synovium and tendon, left shoulder: Secondary | ICD-10-CM

## 2022-12-09 DIAGNOSIS — M25561 Pain in right knee: Secondary | ICD-10-CM

## 2022-12-09 DIAGNOSIS — M546 Pain in thoracic spine: Secondary | ICD-10-CM

## 2022-12-09 NOTE — Other
REHABILITATION SERVICES AT [facility] Services At [phone] [facility] [address]Gu[place] [place] [address]ONG[place] Number: [phone] Number: [phone] Therapy Daily NotePatient Name:  Aaron Irwin Record Number:  [id number] Date of Birth:  20-Sep-1972Therapist:  Oralia Manis, PT Referring Provider:  Glennie Hawk, PAICD-10 Diagnosis(es):Problem List         ICD-10-CM   PT-Right Knee Pain  Right knee pain M25.561 General InformationTherapy Episode of Care   Date of Visit:  12/09/2022    Treatment Number:  4 Precautions/Limitations   Precautions/Limitations:  Standard precautionsMedication Review:Current Outpatient Medications Medication Sig ? atorvastatin 1 tablet ? meloxicam Take 1 tablet (7.5 mg total) by mouth daily. (Patient not taking: Reported on 10/01/2022) ? multivitamin Take 1 capsule by mouth daily. SubjectiveWas able to walk to the grocery store over the weekend, 10 minute walk there and back and was surprised he didn't feel a lot of pain. Application of K tape felt good alsoObjectiveOutcomes Measure not required for this visit                                   Treatment Provided This VisitCPT Code Interventions Timed Minutes Untimed Minutes Total Minutes N/A     Manual Therapy (97140) -DFM/IASTM to patella tendon with R knee flexed over bolster, medial and lateral sides-Kinesiotaping patellar space correction 12  12 Therapeutic Activity (97530) -TG height 20 eccentric lower R squat, concentric phase with both 3x10-LAQ 3x10 5lb ankle weight-Prone quad stretch 3x30 holds R-4 way hips vs GTB; 1 X 10 each B-Standing TKE vs 12.5# on cable column; 3 X 10 R-Upright bike level 3.5 X 8 min 18 8 26  Heat - Ice Pack        Total Treatment Time: 35 Access Code: [email or web address]: [email or web address]: 02/14/2024Prepared by: Consuella Lose HORNExercises- Hooklying Active Hamstring Stretch  - 1-2 x daily - 7 x weekly - 1-2 sets - 10 reps- Supine ITB Stretch with Strap  - 1-2 x daily - 7 x weekly - 1-2 sets - 10 reps- Standing ITB Stretch  - 1-2 x daily - 7 x weekly - 1-2 sets - 10 reps- Supine Piriformis Stretch with Leg Straight  - 1-2 x daily - 7 x weekly - 1-2 sets - 10 reps- Prone Quadriceps Stretch with Strap  - 1-2 x daily - 7 x weekly - 1-2 sets - 10 reps - 5-10 hold- Seated Knee Extension AROM  - 1 x daily - 7 x weekly - 3 sets - 10 repsAssessmentReports that patellar tendon less TTP with manual therapy than the last session, able to initiate closed chain hip/knee strengthening exercises without difficulty and attain full revolutions on bike without much discomfort. PlanFrequency:  2x per weekPlan for Next Visit  Gradual progression of strengthening with closed-chain activities incorporated as patient hopes to return to activities like basketball, jogging eventually.
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
## 2022-12-10 ENCOUNTER — Ambulatory Visit: Admit: 2022-12-10 | Payer: MEDICAID | Attending: Pain Medicine | Primary: Internal Medicine

## 2022-12-11 ENCOUNTER — Encounter: Admit: 2022-12-11 | Payer: MEDICAID | Attending: Pain Medicine | Primary: Internal Medicine

## 2022-12-11 ENCOUNTER — Inpatient Hospital Stay: Admit: 2022-12-11 | Discharge: 2022-12-11 | Payer: MEDICAID | Primary: Internal Medicine

## 2022-12-11 ENCOUNTER — Ambulatory Visit
Admit: 2022-12-11 | Payer: MEDICAID | Attending: Rehabilitative and Restorative Service Providers" | Primary: Internal Medicine

## 2022-12-11 DIAGNOSIS — M67912 Unspecified disorder of synovium and tendon, left shoulder: Secondary | ICD-10-CM

## 2022-12-11 DIAGNOSIS — M25561 Pain in right knee: Secondary | ICD-10-CM
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
## 2022-12-16 ENCOUNTER — Encounter: Admit: 2022-12-16 | Payer: MEDICAID | Attending: Pain Medicine | Primary: Internal Medicine

## 2022-12-16 ENCOUNTER — Ambulatory Visit
Admit: 2022-12-16 | Payer: MEDICAID | Attending: Rehabilitative and Restorative Service Providers" | Primary: Internal Medicine

## 2022-12-16 ENCOUNTER — Ambulatory Visit: Admit: 2022-12-16 | Payer: MEDICAID | Primary: Internal Medicine

## 2022-12-16 DIAGNOSIS — M67912 Unspecified disorder of synovium and tendon, left shoulder: Secondary | ICD-10-CM

## 2022-12-16 DIAGNOSIS — M7918 Myalgia, other site: Secondary | ICD-10-CM

## 2022-12-16 DIAGNOSIS — M546 Pain in thoracic spine: Secondary | ICD-10-CM

## 2022-12-16 DIAGNOSIS — Z9889 Other specified postprocedural states: Secondary | ICD-10-CM

## 2022-12-16 DIAGNOSIS — Z87828 Personal history of other (healed) physical injury and trauma: Secondary | ICD-10-CM

## 2022-12-16 DIAGNOSIS — G8929 Other chronic pain: Secondary | ICD-10-CM

## 2022-12-16 DIAGNOSIS — M47812 Spondylosis without myelopathy or radiculopathy, cervical region: Secondary | ICD-10-CM

## 2022-12-16 DIAGNOSIS — M25561 Pain in right knee: Secondary | ICD-10-CM

## 2022-12-17 ENCOUNTER — Inpatient Hospital Stay: Admit: 2022-12-17 | Discharge: 2022-12-17 | Payer: MEDICAID | Primary: Internal Medicine

## 2022-12-17 DIAGNOSIS — G8929 Other chronic pain: Secondary | ICD-10-CM
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
## 2022-12-18 ENCOUNTER — Telehealth: Admit: 2022-12-18 | Payer: PRIVATE HEALTH INSURANCE | Attending: Pain Medicine | Primary: Internal Medicine

## 2022-12-18 ENCOUNTER — Inpatient Hospital Stay: Admit: 2022-12-18 | Discharge: 2022-12-18 | Payer: MEDICAID | Primary: Internal Medicine

## 2022-12-18 ENCOUNTER — Ambulatory Visit: Admit: 2022-12-18 | Payer: MEDICAID | Primary: Internal Medicine

## 2022-12-18 NOTE — Telephone Encounter
2/28 LM to r/s appt with Dr. Raju on 4/3 to next available

## 2022-12-18 NOTE — Other
[facility] [facility] [place] [address]GNFAO Number: [phone] Number: [phone] Therapy Cancel Note2/28/2024Patient Name:  Aaron Irwin Record Number:  [id number] Date of Birth:  [birth year]Therapist:  Wilson Singer, PTMarc Felipa Furnace cancelled today's appointment.Per front office: no reason given

## 2022-12-19 ENCOUNTER — Encounter: Admit: 2022-12-19 | Payer: PRIVATE HEALTH INSURANCE | Primary: Internal Medicine

## 2022-12-19 DIAGNOSIS — M7918 Myalgia, other site: Secondary | ICD-10-CM

## 2022-12-19 DIAGNOSIS — M25561 Pain in right knee: Secondary | ICD-10-CM

## 2022-12-19 DIAGNOSIS — M47812 Spondylosis without myelopathy or radiculopathy, cervical region: Secondary | ICD-10-CM

## 2022-12-19 DIAGNOSIS — G8929 Other chronic pain: Secondary | ICD-10-CM

## 2022-12-19 DIAGNOSIS — M67912 Unspecified disorder of synovium and tendon, left shoulder: Secondary | ICD-10-CM

## 2022-12-19 DIAGNOSIS — M546 Pain in thoracic spine: Secondary | ICD-10-CM

## 2022-12-19 NOTE — Telephone Encounter
2/29 LM x2 to r/s appt with Dr. Raju on 4/3 to next available and sent Mychart message

## 2022-12-20 ENCOUNTER — Ambulatory Visit
Admit: 2022-12-20 | Payer: MEDICAID | Attending: Rehabilitative and Restorative Service Providers" | Primary: Internal Medicine

## 2022-12-25 ENCOUNTER — Inpatient Hospital Stay: Admit: 2022-12-25 | Discharge: 2022-12-25 | Payer: MEDICAID | Primary: Internal Medicine

## 2022-12-25 ENCOUNTER — Ambulatory Visit: Admit: 2022-12-25 | Payer: MEDICAID | Primary: Internal Medicine

## 2022-12-25 DIAGNOSIS — M25561 Pain in right knee: Secondary | ICD-10-CM

## 2022-12-25 NOTE — Other
[facility] [facility] [address] [address]JWJXB Number: [phone] Number: [phone] Therapy Cancel Note3/6/2024Patient Name:  Aaron Irwin Record Number:  [id number] Date of Birth:  21-May-1972Therapist:  Wilson Singer, PTMarc Felipa Furnace cancelled today's appointment.Per front office: Pt cx'd his appt w/ you due to his appt w/ [place] being cx'd because she's out sick.

## 2022-12-26 ENCOUNTER — Ambulatory Visit
Admit: 2022-12-26 | Payer: MEDICAID | Attending: Rehabilitative and Restorative Service Providers" | Primary: Internal Medicine

## 2022-12-26 ENCOUNTER — Ambulatory Visit: Admit: 2022-12-26 | Payer: MEDICAID | Primary: Internal Medicine

## 2022-12-31 ENCOUNTER — Ambulatory Visit
Admit: 2022-12-31 | Payer: MEDICAID | Attending: Rehabilitative and Restorative Service Providers" | Primary: Internal Medicine

## 2023-01-01 ENCOUNTER — Ambulatory Visit: Admit: 2023-01-01 | Payer: MEDICAID | Primary: Internal Medicine

## 2023-01-01 ENCOUNTER — Encounter: Admit: 2023-01-01 | Payer: MEDICAID | Attending: Trauma Surgery | Primary: Internal Medicine

## 2023-01-08 ENCOUNTER — Inpatient Hospital Stay: Admit: 2023-01-08 | Discharge: 2023-01-08 | Payer: MEDICAID | Primary: Internal Medicine

## 2023-01-08 DIAGNOSIS — M25561 Pain in right knee: Secondary | ICD-10-CM

## 2023-01-08 NOTE — Other
[facility] [facility] [place] [address]UEAVW Number: [phone] Number: [id number] Therapy Cancel Note3/20/2024Patient Name:  Aaron Irwin Record Number:  [id number] Date of Birth:  1972-04-18Therapist:  [place] Toccara Alford, OTMarc Gurry cancelled today's appointment. Pt called to cancel- did not give reason.[place], OTR/L

## 2023-01-08 NOTE — Other
[facility] [facility] [place] [address]UEAVW Number: [phone] Number: [id number] Therapy Cancel Note3/20/2024Patient Name:  Aaron Irwin Record Number:  [id number] Date of Birth:  Apr 01, 1972Therapist:  Wilson Singer, PTMarc Felipa Furnace cancelled today's appointment.Per front office: Pt called to cx. Says he can't come in today. Didn't want to reschedule. Will come next appt.

## 2023-01-13 ENCOUNTER — Encounter: Admit: 2023-01-13 | Payer: MEDICAID | Primary: Internal Medicine

## 2023-01-15 ENCOUNTER — Ambulatory Visit: Admit: 2023-01-15 | Payer: MEDICAID | Primary: Internal Medicine

## 2023-01-22 ENCOUNTER — Ambulatory Visit: Admit: 2023-01-22 | Payer: MEDICAID | Primary: Internal Medicine

## 2023-01-22 ENCOUNTER — Encounter: Admit: 2023-01-22 | Payer: MEDICAID | Attending: Pain Medicine | Primary: Internal Medicine

## 2023-01-29 ENCOUNTER — Ambulatory Visit: Admit: 2023-01-29 | Payer: MEDICAID | Primary: Internal Medicine

## 2023-01-31 ENCOUNTER — Inpatient Hospital Stay: Admit: 2023-01-31 | Discharge: 2023-01-31 | Payer: PRIVATE HEALTH INSURANCE | Primary: Internal Medicine

## 2023-02-05 ENCOUNTER — Ambulatory Visit: Admit: 2023-02-05 | Payer: MEDICAID | Primary: Internal Medicine

## 2023-02-12 ENCOUNTER — Ambulatory Visit: Admit: 2023-02-12 | Payer: MEDICAID | Primary: Internal Medicine

## 2023-02-21 ENCOUNTER — Encounter: Admit: 2023-02-21 | Payer: MEDICAID | Attending: Trauma Surgery | Primary: Internal Medicine

## 2023-03-27 ENCOUNTER — Inpatient Hospital Stay: Admit: 2023-03-27 | Discharge: 2023-03-27 | Payer: PRIVATE HEALTH INSURANCE | Primary: Internal Medicine

## 2023-07-17 ENCOUNTER — Telehealth: Admit: 2023-07-17 | Payer: PRIVATE HEALTH INSURANCE | Primary: Internal Medicine

## 2023-07-17 NOTE — Telephone Encounter
 Good Afternoon Aaron Irwin,I am sorry but we do not schedule consultations for PRP Injections. Pt is requesting a consultation appt for knee PRP Injections. The admins are the one that schedule them.Thank you

## 2023-07-22 NOTE — Telephone Encounter
 Patient calling back waiting for a call back to schedule a consultations for PRP Injections right knee

## 2023-07-23 ENCOUNTER — Encounter: Admit: 2023-07-23 | Payer: PRIVATE HEALTH INSURANCE | Primary: Internal Medicine

## 2023-07-23 NOTE — Telephone Encounter
 Patient called to schedule an appt.  I saw an old referral from 11/2022 for R knee pain. (Hx of arthroscopic knee sx )no recent xrays, pt was inquiring about an injection. He had a previous referral from April that he canceled with [facility].  I scheduled him with Arvid Right.  Pt understands he will be coming for consult only, not injection.  He has records he will be bringing.  No recent xrays  Will J. Antunes see a pt if pt is requesting prp injection  or should I reschedule?

## 2023-07-23 NOTE — Telephone Encounter
 Copied from CRM [id number]. Topic: General Message - [facility]>> Jul 17, 2023  3:26 PM Arma Heading E wrote:[facility] MESSAGETime of call:   3:26 PMCaller:   Masih, MARCCaller's relationship to patient:  Ambulance person from (pharmacy, hospital, agency, etc.):  n/a   Reason for call:   Pt called in regards to requesting a consultation appt for PRP InjectionsIf not feeling well, what are symptoms:  n/a   If having symptoms, how long have the symptoms been present:  n/a   Does caller request to speak to someone urgently?  no   Best telephone number for callback:   [phone] Best time to return call:   AnytimePermission to leave message:  yes   Everette Rank [facility] Scheduler

## 2023-07-23 NOTE — Telephone Encounter
 I am very confused.  This message came directly to me.    What exactly do I need to do?   Is it an FYI?

## 2023-07-23 NOTE — Telephone Encounter
 Lead taking care of this message with team member

## 2023-07-25 ENCOUNTER — Telehealth: Admit: 2023-07-25 | Payer: PRIVATE HEALTH INSURANCE | Primary: Internal Medicine

## 2023-07-25 NOTE — Telephone Encounter
 Pt all set,  he was rescheduled to Dr Waymon Amato 11/6 at 8:30.

## 2023-07-25 NOTE — Telephone Encounter
 Need assistance for scheduling.  Please advise,   thank you Patient called to schedule an appt. He has had  a Hx of arthroscopic knee sx in the past, no recent xrays, pt is inquiring about a PRP injection. History of arthroscopic knee surgery - History of meniscal tear - Right knee pain, unspecified chronicity/no xrays-pt has previous notes from Dr. Gwynneth Macleod, he will be bringing

## 2023-07-29 NOTE — Telephone Encounter
 Copied from CRM [id number]. Topic: Scheduling - Schedule Appointment>> Jul 29, 2023  5:23 PM Ardath Sax wrote:Placing outbound call to JOTHAM, AHN to schedule an appointment.Follow up scheduled for 10/23.

## 2023-07-29 NOTE — Telephone Encounter
 Need assistance for scheduling.  In our meeting with Dr Jon Billings he mentioned he does do PRP injections (looking to clarify this information).If Dr. Jon Billings will consult with patient for PRP, please contact the patient to schedule that consult as per [facility].    Thank you  Patient called to schedule an appt. He has had  a Hx of arthroscopic knee sx in the past, no recent xrays, pt is inquiring about a PRP injection.  History of arthroscopic knee surgery - History of meniscal tear - Right knee pain, unspecified chronicity/no xrays-pt has previous notes from Dr. Gwynneth Macleod, he will be bringing

## 2023-08-13 ENCOUNTER — Encounter: Admit: 2023-08-13 | Payer: PRIVATE HEALTH INSURANCE | Attending: Pain Medicine | Primary: Internal Medicine

## 2023-08-13 ENCOUNTER — Inpatient Hospital Stay: Admit: 2023-08-13 | Discharge: 2023-08-13 | Payer: PRIVATE HEALTH INSURANCE | Primary: Internal Medicine

## 2023-08-13 DIAGNOSIS — M67912 Unspecified disorder of synovium and tendon, left shoulder: Secondary | ICD-10-CM

## 2023-08-13 DIAGNOSIS — M25561 Pain in right knee: Principal | ICD-10-CM

## 2023-08-13 DIAGNOSIS — M546 Pain in thoracic spine: Secondary | ICD-10-CM

## 2023-08-13 DIAGNOSIS — M25562 Pain in left knee: Secondary | ICD-10-CM

## 2023-08-13 DIAGNOSIS — M47812 Spondylosis without myelopathy or radiculopathy, cervical region: Secondary | ICD-10-CM

## 2023-08-13 DIAGNOSIS — M7918 Myalgia, other site: Secondary | ICD-10-CM
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
## 2023-08-25 ENCOUNTER — Telehealth: Admit: 2023-08-25 | Payer: PRIVATE HEALTH INSURANCE | Attending: Pain Medicine | Primary: Internal Medicine

## 2023-08-25 NOTE — Telephone Encounter
 Copied from CRM #[id number]. Topic: Nursing Triage Note - [facility]>> Aug 25, 2023  9:31 AM Konrad Dolores wrote:[facility]:  NURSE TRIAGE MESSAGETime of call:   9:31 AMCaller:   Aaron Irwin, MARCReceived call from Patient, who had a Visit 10/23 with Dr Margaretann Loveless, for his Bilateral Knee pain. He states that an MRI was Ordered for his Right Knee, but is asking if his Left Knee can be included. If not, he wants to know if his Right Knee MRI has been Approved, because he has an Appointment Scheduled for 11/6. Asking for a call back.Jonelle Sidle, [facility] Nurse Advisor

## 2023-08-25 NOTE — Telephone Encounter
 Copied from CRM #[id number]. Topic: General Message - [facility]>> Aug 25, 2023  1:51 PM Veverly Fells wrote:[facility] MESSAGETime of call:   1:51 PMCaller:   [place], MARCCaller's relationship to patient:    Calling from (pharmacy, hospital, agency, etc.):     Reason for call:   Patient calling in for an update on his call from this morning in regards to his MRI to include both knees and if his insurance can cover bothIf not feeling well, what are symptoms:     If having symptoms, how long have the symptoms been present:     Does caller request to speak to someone urgently?  no   Best telephone number for callback:    Best time to return call:    Permission to leave message:     [facility] Referral Specialist

## 2023-08-25 NOTE — Telephone Encounter
 [facility] MESSAGETime of call:   3:45 PMCaller:   Jaryn Hocutt, MARCCaller's relationship to patient:    Calling from (pharmacy, hospital, agency, etc.):     Reason for call:   Pt is calling in regards to having B/L knees MRI sent with authorization before date that he has scheduled on 11/6. Thank you. Best telephone number for callback:   	[phone] Best time to return call:   Anytime Permission to leave message:  yes   [facility] Scheduler

## 2023-08-26 NOTE — Telephone Encounter
 MRI left knee will not be approved by the insurance.  Can consider in the future. Thanks

## 2023-08-26 NOTE — Telephone Encounter
 Copied from CRM #[id number]. Topic: [facility] General CRM - General Inquiry>> Aug 26, 2023  4:10 PM Alexis Frock wrote:[place], [facility] called regarding pt wants to know why it won't be approved he stated that at the time of his appt with Dr. Margaretann Loveless he had [place] but he now has [place], he wants to know if this is why ins won't approve it. Pt is confused and really wants this done at the same time. Please advise pt has appt for MRI tomorrow

## 2023-08-26 NOTE — Telephone Encounter
 Pt is calling states he did speak to his insurance and they will approve left knee MRI and is asking for order to be placed so he can get them both done tom. Thank you.

## 2023-08-27 ENCOUNTER — Encounter: Admit: 2023-08-27 | Payer: PRIVATE HEALTH INSURANCE | Attending: Orthopedic Surgery | Primary: Internal Medicine

## 2023-08-27 ENCOUNTER — Encounter: Admit: 2023-08-27 | Payer: Managed Care (Private) | Primary: Internal Medicine

## 2023-08-27 ENCOUNTER — Inpatient Hospital Stay: Admit: 2023-08-27 | Discharge: 2023-08-27 | Payer: PRIVATE HEALTH INSURANCE | Primary: Internal Medicine

## 2023-08-27 DIAGNOSIS — M25562 Pain in left knee: Secondary | ICD-10-CM

## 2023-08-27 DIAGNOSIS — M25561 Pain in right knee: Principal | ICD-10-CM

## 2023-08-27 NOTE — Telephone Encounter
 Copied from CRM [id number]. Topic: [facility] CRM - General Inquiry>> Aug 27, 2023 11:28 AM Ardath Sax wrote:[place], [facility] called again regarding MRI for his left knee. Make him aware about Dr. Sallyanne Kuster message but pt is concerned because the appts are booking into the new year and he would like to have both knees dones at his appt tonight. Pt is asking if an urgent request can be sent.
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
## 2023-08-27 NOTE — Telephone Encounter
 Returned call and spoke with patient. Advised that Dr. Margaretann Loveless declined to order MRI left knee at this time. Pt verbalized understanding. Advised will have scheduling team contact to schedule f/u appt with Dr. Margaretann Loveless after MRI right knee completed.

## 2023-08-27 NOTE — Telephone Encounter
error 

## 2023-08-28 ENCOUNTER — Telehealth: Admit: 2023-08-28 | Payer: PRIVATE HEALTH INSURANCE | Attending: Pain Medicine | Primary: Internal Medicine

## 2023-08-28 NOTE — Telephone Encounter
 Pt scheduled 12/12 with Dr. Margaretann Loveless

## 2023-09-03 ENCOUNTER — Encounter: Admit: 2023-09-03 | Payer: PRIVATE HEALTH INSURANCE | Attending: Pain Medicine | Primary: Internal Medicine

## 2023-09-03 ENCOUNTER — Telehealth: Admit: 2023-09-03 | Payer: PRIVATE HEALTH INSURANCE | Attending: Pain Medicine | Primary: Internal Medicine

## 2023-09-03 VITALS — Ht 73.0 in | Wt 213.0 lb

## 2023-09-03 DIAGNOSIS — T8859XA Other complications of anesthesia, initial encounter: Secondary | ICD-10-CM

## 2023-09-03 DIAGNOSIS — M25561 Pain in right knee: Secondary | ICD-10-CM

## 2023-09-03 DIAGNOSIS — R200 Anesthesia of skin: Secondary | ICD-10-CM

## 2023-09-03 DIAGNOSIS — M47812 Spondylosis without myelopathy or radiculopathy, cervical region: Secondary | ICD-10-CM

## 2023-09-03 DIAGNOSIS — E7800 Pure hypercholesterolemia, unspecified: Secondary | ICD-10-CM

## 2023-09-03 DIAGNOSIS — M67912 Unspecified disorder of synovium and tendon, left shoulder: Secondary | ICD-10-CM

## 2023-09-03 DIAGNOSIS — M170 Bilateral primary osteoarthritis of knee: Secondary | ICD-10-CM

## 2023-09-03 DIAGNOSIS — L905 Scar conditions and fibrosis of skin: Secondary | ICD-10-CM

## 2023-09-03 DIAGNOSIS — M546 Pain in thoracic spine: Secondary | ICD-10-CM

## 2023-09-03 DIAGNOSIS — M7918 Myalgia, other site: Secondary | ICD-10-CM

## 2023-09-03 NOTE — Telephone Encounter
-----   Message from Marny Lowenstein, DO sent at 09/03/2023  5:09 PM EST -----Schedule bilateral knee injection on any Mon PM after 3 weeks - two different appointments on two separate days, thanks

## 2023-09-04 ENCOUNTER — Encounter: Admit: 2023-09-04 | Payer: PRIVATE HEALTH INSURANCE | Attending: Pain Medicine | Primary: Internal Medicine

## 2023-09-04 ENCOUNTER — Telehealth: Admit: 2023-09-04 | Payer: PRIVATE HEALTH INSURANCE | Attending: Pain Medicine | Primary: Internal Medicine

## 2023-09-04 NOTE — Telephone Encounter
 LVM letting pt know that his appt on 11/18 needs to be rescheduled. Per Dr. Margaretann Loveless his injection was to be scheduled 3 weeks out and at least 1-2 weeks apart. His insurance has not authorized the [employer]. He is currently scheduled on 11/18 and 12/2. I asked pt to call back so that he can be scheduled on 12/2 and 12/9

## 2023-09-04 NOTE — Telephone Encounter
 Patient called stating he received a message from [facility] and was asked to give [place] a call regarding appointment dates for his injection. Patient is keeping the 12/2 appointment and the 11/18 appointment has been moved to 12/9 as requested in phone note below.

## 2023-09-04 NOTE — Telephone Encounter
-----   Message from [place] sent at 09/04/2023  8:48 AM EST -----Dr. Margaretann Loveless, this patient is scheduled on 11/18. You said after 3 weeks. Is that ok? Also, I sent you a message regarding the med you ordered. It's not a preferred med for his insurance. Pt called today to ask if he can move his second injection up to 11/25. He's currently scheduled on 12/2 for the second knee----- Message -----From: Weston Brass, DOSent: 09/03/2023   5:09 PM ESTTo: Janalee Dane, RN; #Schedule bilateral knee injection on any Mon PM after 3 weeks - two different appointments on two separate days, thanks

## 2023-09-08 ENCOUNTER — Encounter: Admit: 2023-09-08 | Payer: PRIVATE HEALTH INSURANCE | Attending: Pain Medicine | Primary: Internal Medicine

## 2023-09-09 NOTE — Progress Notes
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 back if condition changes.Impression & Plan: Listed Harley Alto, DOInterventional Spine and Musculoskeletal MedicinePhysical Medicine and RehabilitationYale  Department of Orthopedics and Rehabilitation

## 2023-09-11 ENCOUNTER — Telehealth: Admit: 2023-09-11 | Payer: PRIVATE HEALTH INSURANCE | Attending: Pain Medicine | Primary: Internal Medicine

## 2023-09-11 MED ORDER — LIDOCAINE (PF) 10 MG/ML (1 %) INJECTION SOLUTION
101 mg/mL (1 %) | Freq: Once | INTRAMUSCULAR | Status: AC
Start: 2023-09-11 — End: 2023-09-16

## 2023-09-11 NOTE — Telephone Encounter
 Pt would like to r/s procedure, and push it forward to 09-30-2023. His mother just passed away and he will be away for the services.

## 2023-09-11 NOTE — Telephone Encounter
 I spoke with pt regarding his request. He is leaving after Thanksgiving for [place] for his mother's funeral. He is asking if we can move up his knee injection to 11/25 from 12/2 since he will be away. Appt rescheduled

## 2023-09-15 ENCOUNTER — Encounter: Admit: 2023-09-15 | Payer: PRIVATE HEALTH INSURANCE | Attending: Pain Medicine | Primary: Internal Medicine

## 2023-09-15 ENCOUNTER — Encounter: Admit: 2023-09-15 | Payer: PRIVATE HEALTH INSURANCE | Primary: Internal Medicine

## 2023-09-15 ENCOUNTER — Ambulatory Visit: Admit: 2023-09-15 | Payer: PRIVATE HEALTH INSURANCE | Primary: Internal Medicine

## 2023-09-15 ENCOUNTER — Telehealth: Admit: 2023-09-15 | Payer: PRIVATE HEALTH INSURANCE | Attending: Pain Medicine | Primary: Internal Medicine

## 2023-09-15 VITALS — BP 142/93 | HR 60

## 2023-09-15 DIAGNOSIS — M25561 Pain in right knee: Secondary | ICD-10-CM

## 2023-09-15 DIAGNOSIS — M546 Pain in thoracic spine: Secondary | ICD-10-CM

## 2023-09-15 DIAGNOSIS — M47812 Spondylosis without myelopathy or radiculopathy, cervical region: Secondary | ICD-10-CM

## 2023-09-15 DIAGNOSIS — M170 Bilateral primary osteoarthritis of knee: Secondary | ICD-10-CM

## 2023-09-15 DIAGNOSIS — M67912 Unspecified disorder of synovium and tendon, left shoulder: Secondary | ICD-10-CM

## 2023-09-15 DIAGNOSIS — M7918 Myalgia, other site: Secondary | ICD-10-CM

## 2023-09-15 MED ORDER — HYALURONATE SODIUM, STABILIZED 60 MG/3 ML INTRA-ARTICULAR SYRINGE
60 | Status: DC
Start: 2023-09-15 — End: 2023-09-16

## 2023-09-15 NOTE — Progress Notes
 PROCEDURE NOTE:			Patient:   Aaron Irwin  						DOB:     1970/11/20   					MRN #: [id number] Procedure Date: 11/25/24PROCEDURE:  Right knee intraarticular Durolane injection via ultrasound guidanceApproach:  Suprapatellar approachAttending Physician: Marny Lowenstein DO Assistant:  Rivka Barbara procedure diagnosis: Knee pain/osteoarthritis Post procedure diagnosis: Same Injectate:  A total of 3 mL of Durolane (high molecular weight hyaluronic acid)Anesthesia: Local with:1% Lidocaine (PF) without epinephrine Comments:  He continues complaining of significant right knee pain.  His mother passed away last week-going for funeral next week.  He has not started physical therapy yet.Time Out performed:  Correct patient identity, procedure to be performed and as applicable, correct side and site, correct patient position.Informed Consent: After review of risks and benefits as well as rationale of the procedure and corticosteroid injection, Aaron Irwin desired to proceed.  The risks were reviewed including but not limited to infection, bleeding, swelling, hematoma formation, neuroma, and neuritis.Technique: Aaron Irwin was placed in supine position with a pillow under the knee.  The procedure was carried out under sterile technique using Betadine, Chlorhexidine/Alcohol swabs.  The area of injection was verified using ultrasound guidance (sterile ultrasound gel and probe cover used): due to effusion, patient's body habitus, deep joint (avoid neurovascular structures), increased BMI, significant pain, trauma. Using a 30 gauge needle, the area was anesthetized with 5mL of local anesthetic mentioned above.  Using a 22 gauge 3.5 in needle under continuous ultrasound guidance, in plane technique, short axis to the knee joint, the suprapatellar bursa was aspirated of 0mL fluid, and then injected with above mentioned solution.  Slight effusion noted at the suprapatellar bursa but aspiration was not attemptedMarc tolerated this injection well.  A band-aid dressing was applied.  Post injection instructions were given including applying a cool pack to the area of injection for 20 minutes on and 20 minutes off upon returning home and again tonight. The patient?s neurological examination remained stable at all times and the patient remained hemodynamically stable. If there are any complications, the patient was instructed to call us.  The patient is to follow-up with me within 4-12 weeks. I was present for the entirety of the procedure. NDC Lidocaine 1%- [id number] Durolane [id number] Margaretann Loveless, DOInterventional Spine and Musculoskeletal MedicinePhysical Medicine and RehabilitationYale Department of Orthopedics and Rehabilitation

## 2023-09-15 NOTE — Telephone Encounter
 Copied from CRM #[id number]. Topic: [facility] CRM - General Inquiry>> Sep 15, 2023  1:14 PM Guinevere Scarlet wrote:Aaron Irwin, [facility] called regarding resheduling his 09/29/23 procedure appt to 09/22/23.I don't believe the [facility] schedules these type of procedures.Thank you for your assistance.

## 2023-09-19 MED ORDER — LIDOCAINE (PF) 10 MG/ML (1 %) INJECTION SOLUTION
10 | Freq: Once | INTRAMUSCULAR | Status: DC
Start: 2023-09-19 — End: 2023-09-23

## 2023-09-22 ENCOUNTER — Ambulatory Visit: Admit: 2023-09-22 | Payer: PRIVATE HEALTH INSURANCE | Primary: Internal Medicine

## 2023-09-22 ENCOUNTER — Encounter: Admit: 2023-09-22 | Payer: PRIVATE HEALTH INSURANCE | Attending: Pain Medicine | Primary: Internal Medicine

## 2023-09-22 VITALS — BP 131/89 | HR 64

## 2023-09-22 DIAGNOSIS — T8859XA Other complications of anesthesia, initial encounter: Secondary | ICD-10-CM

## 2023-09-22 DIAGNOSIS — R200 Anesthesia of skin: Secondary | ICD-10-CM

## 2023-09-22 DIAGNOSIS — M170 Bilateral primary osteoarthritis of knee: Secondary | ICD-10-CM

## 2023-09-22 DIAGNOSIS — E7800 Pure hypercholesterolemia, unspecified: Secondary | ICD-10-CM

## 2023-09-22 DIAGNOSIS — M25561 Pain in right knee: Secondary | ICD-10-CM

## 2023-09-22 DIAGNOSIS — L905 Scar conditions and fibrosis of skin: Secondary | ICD-10-CM

## 2023-09-22 MED ORDER — HYALURONATE SODIUM, STABILIZED 60 MG/3 ML INTRA-ARTICULAR SYRINGE
60 | Status: DC
Start: 2023-09-22 — End: 2023-09-23

## 2023-09-22 NOTE — Progress Notes
 PROCEDURE NOTE:			Patient:   Aaron Irwin  						DOB:     03/19/1971   					MRN #: [id number] Procedure Date: 12/02/24PROCEDURE:  Left knee intraarticular Aspiration/Durolane injection via ultrasound guidanceApproach:  Suprapatellar approachAttending Physician: Marny Lowenstein DO Assistant:  Rivka Barbara procedure diagnosis: Knee pain/osteoarthritis Post procedure diagnosis: Same Injectate:  A total of 3 mL of Durolane (high molecular weight hyaluronic acid)Anesthesia: Local with:1% Lidocaine (PF) without epinephrine Comments:  He continues complaining of significant left knee pain.  Right knee is already feeling significantly better-60-70% better.  His mother passed away -going for funeral tomorrow.  He has not started physical therapy yet.Time Out performed:  Correct patient identity, procedure to be performed and as applicable, correct side and site, correct patient position.Informed Consent: After review of risks and benefits as well as rationale of the procedure and corticosteroid injection, Aaron Irwin desired to proceed.  The risks were reviewed including but not limited to infection, bleeding, swelling, hematoma formation, neuroma, and neuritis.Technique: Aaron Irwin was placed in supine position with a pillow under the knee.  The procedure was carried out under sterile technique using Betadine, Chlorhexidine/Alcohol swabs.  The area of injection was verified using ultrasound guidance (sterile ultrasound gel and probe cover used): due to effusion, patient's body habitus, deep joint (avoid neurovascular structures), increased BMI, significant pain, trauma. Using a 30 gauge needle, the area was anesthetized with 3mL of local anesthetic mentioned above.  Using a 22 gauge 3.5 in needle under continuous ultrasound guidance, in plane technique, short axis to the knee joint, the suprapatellar bursa was aspirated of 10mL yellowish fluid, and then injected with above mentioned solution.  [name] tolerated this injection well.  A band-aid dressing was applied.  Post injection instructions were given including applying a cool pack to the area of injection for 20 minutes on and 20 minutes off upon returning home and again tonight. The patient?s neurological examination remained stable at all times and the patient remained hemodynamically stable. If there are any complications, the patient was instructed to call us.  The patient is to follow-up with me within 4-12 weeks. I was present for the entirety of the procedure. NDC Lidocaine 1%- [id number] Durolane [id number] Margaretann Loveless, DOInterventional Spine and Musculoskeletal MedicinePhysical Medicine and RehabilitationYale Department of Orthopedics and Rehabilitation

## 2023-09-29 ENCOUNTER — Encounter: Admit: 2023-09-29 | Payer: PRIVATE HEALTH INSURANCE | Attending: Pain Medicine | Primary: Internal Medicine

## 2023-10-02 ENCOUNTER — Encounter: Admit: 2023-10-02 | Payer: PRIVATE HEALTH INSURANCE | Attending: Pain Medicine | Primary: Internal Medicine

## 2023-11-05 ENCOUNTER — Telehealth: Admit: 2023-11-05 | Payer: PRIVATE HEALTH INSURANCE | Attending: Pain Medicine | Primary: Internal Medicine

## 2023-11-05 NOTE — Telephone Encounter
-----   Message from [place] R sent at 11/05/2023  9:07 AM EST -----Regarding: FW: Left knee pain----- Message -----From: Timothy Lasso, DanielleSent: 11/05/2023   8:45 AM ESTTo: Edyth Gunnels HavenSubject: Left knee pain                               Good morning, This patient stopped by the office today inquiring a sooner follow up appointment with Dr. Margaretann Loveless. It looks like he has an TH appt with him 1/22 but patient states he would like to see him in person. His left knee is bothering him since he had his last procedure done. He is requesting if someone could give him a call. Thank you,Danielle

## 2023-11-05 NOTE — Telephone Encounter
 I spoke with pt regarding his left knee. He is s/p Left knee intraarticular Aspiration/Durolane injection via ultrasound guidance on 09/22/23. He has noticed no relief in his pain. He previously had a Durolane injection on his right knee with noticeable improvement in his pain, but not with the left knee. He is still rating his pain a 5/10 daily. He has had an xray on his left knee, butnot an MRI. He is wondering if Dr. Margaretann Loveless thinks that an MRI is warranted. He denies swelling or redness. He isn't really taking anything for pain control. He has a TH visit scheduled on 11/12/23, but he's wondering if Dr. Margaretann Loveless would want to see him in person or order an MRI before the [facility] visit. I let patient knoiw that I would forward his inquiry to Dr. Margaretann Loveless and let him know when I hear back. Pt verbalized understanding

## 2023-11-06 NOTE — Telephone Encounter
 Called patient back to relay Dr. Sallyanne Kuster message below:Telemedicine is okay.  I can decide on MRI versus next options next visit. Pt verbalized understanding and will keep TH visit next week

## 2023-11-12 ENCOUNTER — Encounter: Admit: 2023-11-12 | Payer: PRIVATE HEALTH INSURANCE | Attending: Pain Medicine | Primary: Internal Medicine

## 2023-11-12 VITALS — Ht 73.0 in | Wt 210.0 lb

## 2023-11-12 DIAGNOSIS — E7800 Pure hypercholesterolemia, unspecified: Secondary | ICD-10-CM

## 2023-11-12 DIAGNOSIS — M546 Pain in thoracic spine: Secondary | ICD-10-CM

## 2023-11-12 DIAGNOSIS — M67912 Unspecified disorder of synovium and tendon, left shoulder: Secondary | ICD-10-CM

## 2023-11-12 DIAGNOSIS — M7918 Myalgia, other site: Secondary | ICD-10-CM

## 2023-11-12 DIAGNOSIS — M25561 Pain in right knee: Principal | ICD-10-CM

## 2023-11-12 DIAGNOSIS — M170 Bilateral primary osteoarthritis of knee: Secondary | ICD-10-CM

## 2023-11-12 DIAGNOSIS — R200 Anesthesia of skin: Secondary | ICD-10-CM

## 2023-11-12 DIAGNOSIS — M47812 Spondylosis without myelopathy or radiculopathy, cervical region: Secondary | ICD-10-CM

## 2023-11-12 DIAGNOSIS — T8859XA Other complications of anesthesia, initial encounter: Secondary | ICD-10-CM

## 2023-11-12 DIAGNOSIS — L905 Scar conditions and fibrosis of skin: Principal | ICD-10-CM

## 2023-11-12 NOTE — Progress Notes
 Gastrointestinal: Negative.  Musculoskeletal:  Positive for joint swelling.      Follow up Skin: Negative.  Hematological: Negative.  Psychiatric/Behavioral: Negative.

## 2023-11-12 NOTE — Progress Notes
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 consent and acknowledges agreement to permit telehealth for this visit.[place] patient is located in: [place] clinician is appropriately licensed in the above state to provide care for this visit.Other individuals present during the telehealth encounter and their role/relation: noneBecause this visit was completed over video, a hands-on physical exam was not performed. Patient/parent or guardian understands and knows to call back if condition changes.Impression & Plan: Listed aboveRobin  Margaretann Loveless, DOInterventional Spine and Musculoskeletal MedicinePhysical Medicine and RehabilitationYale Department of Orthopedics and Rehabilitation

## 2023-11-24 ENCOUNTER — Telehealth: Admit: 2023-11-24 | Payer: PRIVATE HEALTH INSURANCE | Attending: Pain Medicine | Primary: Internal Medicine

## 2023-11-24 NOTE — Telephone Encounter
 Antibiotics, infection or illness within past two weeks? NoVaccines within past two weeks including flu or COVID or COVID Booster? No	Completed COVID vaccine? Yes Date of completion: n/a		Patient should bring vaccination card to appointment if not performed within [facility]. If patient is not vaccinated or partially vaccinated, please make sure a COVID test order is place by a provider.	Notify patient no vaccines should be received between pre-procedure call and procedure date: confirmedSurgery or major dental work within past six weeks? NoPast reaction/allergy to contrast dye? No	Reaction:	Requires pre-treatment per protocol: N/A	* Severe Reaction to contrast media (ie shellfish, topical iodine, peanuts) may require pre medication. Contact providerTaking blood thinners (ask about Plavix, Aspirin or any anticoagulant)? No 	Drug:	Anticipated last dose:	Prescribing MD:	Date Cardiac Letter Sent:	Cervical (except TPI), interlaminar or caudal ESI need to hold ASA x 7 days 	and NSAIDS x 24 hours.Cardiac defibrillator or pacemaker? No If yes, cannot [facility]. Do you have an allergy to chlorhexidine? No Any chance of pregnancy? N/ATransportation to/from appointment? No, message sent to [facility]	All procedures (except bedside procedures) require transportation.	If patient does not have transportation and requires it, need to reschedule.Patient to contact office with any further questions or concerns prior to this appointment.Patient advised to contact our office should there be any changes to health (illness, antibiotics, etc.) prior to this procedure. Confirmed appointment date and time.

## 2023-11-24 NOTE — Telephone Encounter
 Called pt back to let him know that per Dr. Margaretann Loveless. Ok to drive self for procedure

## 2023-11-25 ENCOUNTER — Encounter: Admit: 2023-11-25 | Payer: PRIVATE HEALTH INSURANCE | Primary: Internal Medicine

## 2023-11-25 ENCOUNTER — Ambulatory Visit: Admit: 2023-11-25 | Payer: PRIVATE HEALTH INSURANCE | Attending: Pain Medicine | Primary: Internal Medicine

## 2023-11-25 ENCOUNTER — Ambulatory Visit: Admit: 2023-11-25 | Payer: PRIVATE HEALTH INSURANCE | Primary: Internal Medicine

## 2023-11-25 ENCOUNTER — Telehealth: Admit: 2023-11-25 | Payer: PRIVATE HEALTH INSURANCE | Attending: Pain Medicine | Primary: Internal Medicine

## 2023-11-25 DIAGNOSIS — M7918 Myalgia, other site: Secondary | ICD-10-CM

## 2023-11-25 DIAGNOSIS — M170 Bilateral primary osteoarthritis of knee: Secondary | ICD-10-CM

## 2023-11-25 DIAGNOSIS — M25561 Pain in right knee: Secondary | ICD-10-CM

## 2023-11-25 NOTE — Telephone Encounter
 Patient wanted to make sure that he was a approved for his procedure.  Looks like it was but gave billing phone number.

## 2023-12-01 ENCOUNTER — Encounter: Admit: 2023-12-01 | Payer: PRIVATE HEALTH INSURANCE | Attending: Pain Medicine | Primary: Internal Medicine

## 2023-12-01 DIAGNOSIS — M25561 Pain in right knee: Secondary | ICD-10-CM

## 2023-12-01 DIAGNOSIS — M170 Bilateral primary osteoarthritis of knee: Secondary | ICD-10-CM

## 2023-12-01 DIAGNOSIS — M7918 Myalgia, other site: Secondary | ICD-10-CM

## 2023-12-15 ENCOUNTER — Encounter: Admit: 2023-12-15 | Payer: PRIVATE HEALTH INSURANCE | Primary: Internal Medicine

## 2023-12-15 ENCOUNTER — Telehealth: Admit: 2023-12-15 | Payer: PRIVATE HEALTH INSURANCE | Attending: Pain Medicine | Primary: Internal Medicine

## 2023-12-15 NOTE — Telephone Encounter
 Pt returning missed pre-procedure call.

## 2023-12-15 NOTE — Telephone Encounter
 Left message for patient for pre-procedure nursing screening. Requested return phone call from patient. Please route to nursing pool for return phone call and documentation.

## 2023-12-15 NOTE — Telephone Encounter
 Spoke to patient over the phone.  Explained PRP process.  He would like to proceed with it.

## 2023-12-15 NOTE — Telephone Encounter
 Antibiotics, infection or illness within past two weeks? NoVaccines within past two weeks including flu or COVID or COVID Booster? No	Completed COVID vaccine? Yes Date of completion: n/a		Patient should bring vaccination card to appointment if not performed within [facility]. If patient is not vaccinated or partially vaccinated, please make sure a COVID test order is place by a provider.	Notify patient no vaccines should be received between pre-procedure call and procedure date: confirmedSurgery or major dental work within past six weeks? NoPast reaction/allergy to contrast dye? No	Reaction:	Requires pre-treatment per protocol: N/A	* Severe Reaction to contrast media (ie shellfish, topical iodine, peanuts) may require pre medication. Contact providerTaking blood thinners (ask about Plavix, Aspirin or any anticoagulant)? No 	Drug:	Anticipated last dose:	Prescribing MD:	Date Cardiac Letter Sent:	Cervical (except TPI), interlaminar or caudal ESI need to hold ASA x 7 days 	and NSAIDS x 24 hours.Cardiac defibrillator or pacemaker? No If yes, cannot perform [facility]. Do you have an allergy to chlorhexidine? No Any chance of pregnancy? N/ATransportation to/from appointment? N/A	All procedures (except bedside procedures) require transportation.	If patient does not have transportation and requires it, need to reschedule.Patient to contact office with any further questions or concerns prior to this appointment.Patient advised to contact our office should there be any changes to health (illness, antibiotics, etc.) prior to this procedure. Confirmed appointment date and time.

## 2023-12-16 ENCOUNTER — Ambulatory Visit: Admit: 2023-12-16 | Payer: PRIVATE HEALTH INSURANCE | Attending: Pain Medicine | Primary: Internal Medicine

## 2023-12-16 ENCOUNTER — Ambulatory Visit: Admit: 2023-12-16 | Payer: PRIVATE HEALTH INSURANCE | Primary: Internal Medicine

## 2023-12-18 MED ORDER — SODIUM CHLORIDE 0.9 % (FLUSH) INJECTION SYRINGE
0.9 | Freq: Once | Status: CP
Start: 2023-12-18 — End: ?
  Administered 2023-12-22: 01:00:00 0.9 mL

## 2023-12-18 MED ORDER — LIDOCAINE (PF) 10 MG/ML (1 %) INJECTION SOLUTION
101 | Freq: Once | INTRAMUSCULAR | Status: CP
Start: 2023-12-18 — End: ?
  Administered 2023-12-22: 01:00:00 101 mL via INTRAMUSCULAR

## 2023-12-22 ENCOUNTER — Encounter: Admit: 2023-12-22 | Payer: PRIVATE HEALTH INSURANCE | Attending: Pain Medicine | Primary: Internal Medicine

## 2023-12-22 ENCOUNTER — Ambulatory Visit: Admit: 2023-12-22 | Payer: PRIVATE HEALTH INSURANCE | Primary: Internal Medicine

## 2023-12-22 VITALS — BP 141/97 | HR 71

## 2023-12-22 DIAGNOSIS — M25562 Pain in left knee: Secondary | ICD-10-CM

## 2023-12-22 DIAGNOSIS — M170 Bilateral primary osteoarthritis of knee: Secondary | ICD-10-CM

## 2023-12-22 NOTE — Patient Instructions
 Post-procedure (PRP) rehabilitation protocol Intra-Articular Knee Time Frame Restrictions, rehab exercises Days 0-2 -Minimal weight-bearing for lower extremity, utilize a cane.-Gentle active range of motion multiple times per day.-Ice, compression, and elevation. Days 3-14 -Use cane for days 3-7, then gradually increase to weight-bearing as tolerated to full days 8-14 and discontinue use of the cane. -Continue ice, compression, elevation, and active range of motion.  Weeks 2-6 -Isometric exercises (quadriceps set, glute squeezes)-Passive stretching-Stationary bike with light resistance Weeks 6 and Beyond -Exercise as tolerated - Ice routinely 1-2-4 days. 10-15 minute applications of ice can be repeated every 2 hours if needed for relief of pain and swelling. - Avoid aspirin and non-steroidal anti-inflammatories for at least 6 weeks, since they interfere with the healing process. - Okay to use acetaminophen (Tylenol), Tramadol, Percocet, Vicodin as needed for pain relief.

## 2023-12-22 NOTE — Progress Notes
 PLATELET RICH PLASMA KNEE JOINT INJECTION WITH ULTRASOUND GUIDANCE		 PROCEDURE NOTE:			Patient:   Aaron Irwin  						DOB:     December 23, 1970   					MRN #: [id number] Procedure Date: 03/03/25PROCEDURE:  Left knee Joint Intra-articular Platelet Rich Plasma (PRP) Injection With Ultrasound GuidanceApproach:  AnteriorAttending Physician: Weston Brass, DOAssistant:  NonePre procedure diagnosis:  Knee pain/osteoarthritis Anesthesia:  Local - 1% lidocaine (preservative free) - 1cc Injectate:   Platelet rich plasma (PRP)- leukocyte poor (69ml)Comments:  He continues complaining significant pain to left knee.  Pain gets worse with ambulation.  His left knee is slightly swollen today.  No infection in the last 2 weeks.  He would like to proceed with PRP.  60 milliliters of blood was drawn from the patient afterwards.Interventional and non-interventional treatment options were discussed with the patient and the patient acknowledged agreement with the plan. Risks and benefits of the procedure as outlined on the consent form were discussed with the patient. These risks include but are not limited to vasovagal response (passing out), injection site soreness, new pain, worsening of pain, infection, bleeding, permanent skin changes, allergic or unexpected drug reaction with minor or major consequences, nerve injury. Procedural pause conducted to verify:  correct patient identity, procedure to be performed and as applicable, correct side and site, correct patient position, and availability of implants, special equipment or special requirements.  The use of direct ultrasound visualization of the needle (rather than a non-guided injection) was required to ensure accurate injection placement so there can be diagnostic specificity when evaluating  effectiveness of the injection, and for safety purposes to minimize risk of bleeding or injury to nearby neurovascular structures.. The right suprapatellar bursa was identified by ultrasound, and probe location was marked on skin w/ needle cap. Then the artery and vein was identified and marked. Chloreprep were used to sterilize the area. A 30 gauge needle was used to inject loca anesthetic mentioned above in a layer by layer method.  There was no blood in the hub on aspiration prior to any medication delivery. Then a sterile 22 gauge 3.5 inch spinal needle was inserted towards suprapatellar bursa under direct ultrasound guidance, taking care to avoid the artery and vein previously identified.  20 cc of yellowish fluid was aspirated and the above injectate was placed. A good capsular distension pattern was noted confirming intra-articular placement. The needle was removed and pressure applied to the area to ensure hemostasis.Post Procedure:After washing off the betadine a band-aid was used to cover the injection site.  There were no complications during or after the procedure. The patient?s neurological examination remained stable at all times and the patient remained hemodynamically stable. If there are any complications, the patient was instructed to call us.  The patient is to follow-up with me within 6-8 weeks. I was present for the entirety of the procedure. Ordered PT for knee stretching and strengthening.  Provider letter-no prolonged standing till next Monday.Marny Lowenstein, DOInterventional Spine and Musculoskeletal MedicinePhysical Medicine and RehabilitationYale Department of Orthopedics and Rehabilitation

## 2023-12-23 ENCOUNTER — Ambulatory Visit: Admit: 2023-12-23 | Payer: PRIVATE HEALTH INSURANCE | Attending: Pain Medicine | Primary: Internal Medicine

## 2023-12-23 ENCOUNTER — Ambulatory Visit: Admit: 2023-12-23 | Payer: PRIVATE HEALTH INSURANCE | Primary: Internal Medicine

## 2024-01-05 ENCOUNTER — Telehealth: Admit: 2024-01-05 | Payer: PRIVATE HEALTH INSURANCE | Attending: Pain Medicine | Primary: Internal Medicine

## 2024-01-05 DIAGNOSIS — M7918 Myalgia, other site: Secondary | ICD-10-CM

## 2024-01-05 DIAGNOSIS — M170 Bilateral primary osteoarthritis of knee: Secondary | ICD-10-CM

## 2024-01-05 DIAGNOSIS — M25561 Pain in right knee: Secondary | ICD-10-CM

## 2024-01-05 NOTE — Telephone Encounter
 Patient came into [name] location asking when he is able to start therapy in the pool again. He said he had an injection on 3/3 and was told there would be a certain amount of time that he was unable to go in the pool. Patient said he can be reached by phone.

## 2024-01-06 NOTE — Telephone Encounter
 Ordered PT-aquatherapy.  Please fax to [facility] which is the only place I know.  Please inform the patient.  Thanks

## 2024-01-06 NOTE — Telephone Encounter
 I spoke with patient to let him know that he can do aquatherapy 2 weeks after PRP. His PT referral currently is not for aquatherapy. He is wondering if Dr. Margaretann Loveless would place a referral instead for aquatherapy and if he had any recommendations on where to go that would be close to his house. Pt lives in [facility]

## 2024-01-07 ENCOUNTER — Telehealth: Admit: 2024-01-07 | Payer: PRIVATE HEALTH INSURANCE | Attending: Pain Medicine | Primary: Internal Medicine

## 2024-01-07 NOTE — Telephone Encounter
.   Patient came into [facility] to inquire about referral.

## 2024-02-26 ENCOUNTER — Encounter: Admit: 2024-02-26 | Payer: PRIVATE HEALTH INSURANCE | Attending: Pain Medicine | Primary: Internal Medicine

## 2024-03-18 ENCOUNTER — Encounter: Admit: 2024-03-18 | Payer: PRIVATE HEALTH INSURANCE | Attending: Pain Medicine | Primary: Internal Medicine

## 2024-03-18 VITALS — Ht 73.0 in | Wt 210.0 lb

## 2024-03-18 DIAGNOSIS — M170 Bilateral primary osteoarthritis of knee: Secondary | ICD-10-CM

## 2024-03-18 DIAGNOSIS — L905 Scar conditions and fibrosis of skin: Secondary | ICD-10-CM

## 2024-03-18 DIAGNOSIS — M7918 Myalgia, other site: Secondary | ICD-10-CM

## 2024-03-18 DIAGNOSIS — E7800 Pure hypercholesterolemia, unspecified: Secondary | ICD-10-CM

## 2024-03-18 DIAGNOSIS — T8859XA Other complications of anesthesia, initial encounter: Secondary | ICD-10-CM

## 2024-03-18 DIAGNOSIS — M25561 Pain in right knee: Secondary | ICD-10-CM

## 2024-03-18 DIAGNOSIS — R200 Anesthesia of skin: Secondary | ICD-10-CM

## 2024-03-18 NOTE — Progress Notes
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 this visit was completed over video, a hands-on physical exam was not performed. Patient/parent or guardian understands and knows to call back if condition changes.Impression & Plan: Listed aboveRobin Areliz Rothman, DOInterventional Spine and Musculoskeletal MedicinePhysical Medicine and RehabilitationYale Department of Orth[facility] and Rehabilitation

## 2024-03-19 ENCOUNTER — Encounter: Admit: 2024-03-19 | Payer: PRIVATE HEALTH INSURANCE | Primary: Internal Medicine

## 2024-03-22 NOTE — Progress Notes
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  Other individuals actively participating in the telephone encounter and their name/relation to the patient: noneGreater than 10 minutes of time was spent with this patient in medical discussion to support the telephone (audio only) visit: Yes On the day of this patient's encounter, a total of 22 minutes was personally spent by me.  This does not include any resident/fellow teaching time, or any time spent performing a procedural service.Because this visit was completed over telephone, a hands-on physical exam was not performed.  Patient understands and knows to call back if condition changes.Impression & Plan: Listed aboveRobin Cyan Moultrie, DOInterventional Spine  and Musculoskeletal MedicinePhysical Medicine and RehabilitationYale Department of Orth[facility] and Rehabilitation

## 2024-04-12 ENCOUNTER — Encounter: Admit: 2024-04-12 | Payer: PRIVATE HEALTH INSURANCE | Primary: Internal Medicine

## 2024-06-18 ENCOUNTER — Encounter: Admit: 2024-06-18 | Payer: PRIVATE HEALTH INSURANCE | Attending: Pain Medicine | Primary: Internal Medicine

## 2024-06-18 ENCOUNTER — Telehealth: Admit: 2024-06-18 | Payer: PRIVATE HEALTH INSURANCE | Attending: Pain Medicine | Primary: Internal Medicine

## 2024-06-18 DIAGNOSIS — M170 Bilateral primary osteoarthritis of knee: Secondary | ICD-10-CM

## 2024-06-18 DIAGNOSIS — M7918 Myalgia, other site: Secondary | ICD-10-CM

## 2024-06-18 DIAGNOSIS — R29898 Other symptoms and signs involving the musculoskeletal system: Secondary | ICD-10-CM

## 2024-06-18 DIAGNOSIS — M25561 Pain in right knee: Principal | ICD-10-CM

## 2024-06-18 NOTE — Progress Notes
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 exam was not performed. Patient/parent or guardian understands and knows to call back if condition changes. Impression & Plan: Listed aboveRobin Murial Beam, DOInterventional Spine and Musculoskeletal MedicinePhysical Medicine and RehabilitationYale Department of Orthopedics and Rehabilitation

## 2024-06-18 NOTE — Telephone Encounter
 Left voice mail for patient to schedule their 3-43month in office fu per Dr. Dwight hugger msg.

## 2024-06-19 ENCOUNTER — Encounter: Admit: 2024-06-19 | Payer: PRIVATE HEALTH INSURANCE | Primary: Internal Medicine

## 2024-07-15 ENCOUNTER — Encounter: Admit: 2024-07-15 | Payer: PRIVATE HEALTH INSURANCE | Attending: Pain Medicine | Primary: Internal Medicine

## 2024-07-15 ENCOUNTER — Encounter: Admit: 2024-07-15 | Payer: PRIVATE HEALTH INSURANCE | Attending: Orthopedic Surgery | Primary: Internal Medicine

## 2024-07-15 ENCOUNTER — Telehealth: Admit: 2024-07-15 | Payer: PRIVATE HEALTH INSURANCE | Primary: Internal Medicine

## 2024-07-15 NOTE — Telephone Encounter
 Pt returning missed call.

## 2024-07-15 NOTE — Telephone Encounter
 Copied from CRM #[id number]. Topic: General Message - [facility]>> Jul 15, 2024 11:07 AM Tillman PEDLAR wrote:[facility] MESSAGETime of call:   11:07 AMCaller:   Becvar, MARCCaller's relationship to patient:  self  Calling from (pharmacy, hospital, agency, etc.):  n/a   Specialist you are calling for:  Dr. Franchot for call:  Patient would like a call back to discuss pain and swelling in left knee.  He states he is in a lot of pain.  Symptoms began last night.  Best telephone number for callback:   [phone] Best time to return call:     Permission to leave message:  yes   Chella Chapdelaine ZirlisYM CARE Center Scheduler

## 2024-07-15 NOTE — Telephone Encounter
 RTC and spoke with patient. Patient stated he was currently at a volley ball match watching his daughter. Patient states that he's having severe left lateral knee pain 9-10 and his knee is swollen, the size of a grapefruit. 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 Patient has not taken any OTC NSAID's or tylenol , has not elevated the knee or tried ice. Today he reports that he went to work but that walking exacerbates the pain. This writer advised patient to try the OTC NSAID's (taken with food) and tylenol  (no more than 3000mg /day), ice for 20 minute intervals and elevation. This Clinical research associate also stated she would relay this message to Dr Dwight and see if he could be seen sooner. Patient verbalized understanding with no additional questions or concerns.

## 2024-07-15 NOTE — Telephone Encounter
 RTC and LVM for patient to [facility] to [facility]

## 2024-07-22 ENCOUNTER — Encounter: Admit: 2024-07-22 | Payer: PRIVATE HEALTH INSURANCE | Attending: Pain Medicine | Primary: Internal Medicine

## 2024-07-22 DIAGNOSIS — M25561 Pain in right knee: Principal | ICD-10-CM

## 2024-07-22 DIAGNOSIS — M170 Bilateral primary osteoarthritis of knee: Secondary | ICD-10-CM

## 2024-07-22 DIAGNOSIS — R29898 Other symptoms and signs involving the musculoskeletal system: Secondary | ICD-10-CM

## 2024-07-22 DIAGNOSIS — M7918 Myalgia, other site: Secondary | ICD-10-CM
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
## 2024-07-29 ENCOUNTER — Encounter: Admit: 2024-07-29 | Payer: PRIVATE HEALTH INSURANCE | Attending: Pain Medicine | Primary: Internal Medicine

## 2024-08-02 ENCOUNTER — Telehealth: Admit: 2024-08-02 | Payer: PRIVATE HEALTH INSURANCE | Attending: Pain Medicine | Primary: Internal Medicine

## 2024-08-02 NOTE — Telephone Encounter
 Antibiotics, infection or illness within past two weeks? NoVaccines within past two weeks including flu or COVID or COVID Booster? No	Notify patient no vaccines should be received between pre-procedure call and procedure date: confirmedSurgery or major dental work within past six weeks? NoPast reaction/allergy to contrast dye? N/A	Reaction:	Requires pre-treatment per protocol: N/A	* Severe Reaction to contrast media (ie shellfish, topical iodine , peanuts) may require pre medication. Contact providerTaking blood thinners (ask about Plavix, Aspirin or any anticoagulant)? No 	Drug:	Anticipated last dose:	Prescribing MD:	Date Cardiac Letter Sent:	Cervical (except TPI), interlaminar or caudal ESI need to hold ASA x 7 days 	and NSAIDS x 24 hours.Cardiac defibrillator or pacemaker? NoIf yes, cannot perform [facility]. Do you have an allergy to chlorhexidine ? No Any chance of pregnancy? NoTransportation to/from appointment? N/A	All procedures (except bedside procedures) require transportation.	If patient does not have transportation and requires it, need to reschedule.Patient to contact office with any further questions or concerns prior to this appointment.Patient advised to contact our office should there be any changes to health (illness, antibiotics, etc.) prior to this procedure. Confirmed appointment date and time.

## 2024-08-05 MED ORDER — LIDOCAINE HCL 10 MG/ML (1 %) INJECTION SOLUTION
10 | Freq: Once | INTRAMUSCULAR | Status: CP
Start: 2024-08-05 — End: ?
  Administered 2024-08-09: 16:00:00 10 mL via INTRAMUSCULAR

## 2024-08-05 MED ORDER — ROPIVACAINE (PF) 2 MG/ML (0.2 %) INJECTION SOLUTION
2 | Freq: Once | Status: CP
Start: 2024-08-05 — End: ?
  Administered 2024-08-09: 16:00:00 2 mL

## 2024-08-05 MED ORDER — TRIAMCINOLONE ACETONIDE 40 MG/ML SUSPENSION FOR INJECTION
40 | Freq: Once | INTRA_ARTICULAR | Status: CP
Start: 2024-08-05 — End: ?
  Administered 2024-08-09: 16:00:00 40 mL via INTRA_ARTICULAR

## 2024-08-09 ENCOUNTER — Encounter: Admit: 2024-08-09 | Payer: PRIVATE HEALTH INSURANCE | Attending: Pain Medicine | Primary: Internal Medicine

## 2024-08-09 ENCOUNTER — Encounter: Admit: 2024-08-09 | Payer: PRIVATE HEALTH INSURANCE | Primary: Internal Medicine

## 2024-08-09 VITALS — BP 136/83 | HR 66

## 2024-08-09 DIAGNOSIS — M7918 Myalgia, other site: Secondary | ICD-10-CM

## 2024-08-09 DIAGNOSIS — M25561 Pain in right knee: Principal | ICD-10-CM

## 2024-08-09 DIAGNOSIS — M170 Bilateral primary osteoarthritis of knee: Secondary | ICD-10-CM

## 2024-08-09 MED ORDER — TRIAMCINOLONE ACETONIDE 40 MG/ML SUSPENSION FOR INJECTION
40 | Status: CP
Start: 2024-08-09 — End: ?

## 2024-08-09 NOTE — Progress Notes
 PROCEDURE NOTE:			Patient:   Aaron Irwin  						DOB:     1970/12/09   					MRN #: [id number] Procedure Date: 10/20/25PROCEDURE: Left knee intraarticular aspiration/steroid injection via ultrasound guidanceApproach:  Suprapatellar approachAttending Physician: Dr. Grayce Brim Assistant:  Mylene procedure diagnosis: Knee pain/osteoarthritis Post procedure diagnosis: Same Injectate:  A total of 5mL consisting of: 0.5mL Triamcinolone  acetonide/Kenalog  (40mg /mL)4.5mL 0.2% Ropivacaine  without epinephrine  (preservative free)Anesthesia: Local with:1% Lidocaine  (PF) without epinephrine  Comments:  He continues complaining significant pain to the left knee along with effusion.  This has been going for almost 1 month.  Last physical therapy session was 1 month ago.  He is ready to start physical therapy.Time Out performed:  Correct patient identity, procedure to be performed and as applicable, correct side and site, correct patient position.Informed Consent: After review of risks and benefits as well as rationale of the procedure and corticosteroid injection, [name] desired to proceed.  The risks were reviewed including but not limited to infection, bleeding, hematoma formation, neuroma, neuritis, skin dimpling, hyperpigmentation, elevated blood glucose levels, and steroid flare.Technique: [name] was placed in supine position with a pillow under the knee.  The procedure was carried out under sterile technique using Betadine, Chlorhexidine swabs.  The area of injection was verified using ultrasound guidance (sterile ultrasound gel and probe cover used): due to significant effusion, patient's body habitus, deep joint (avoid neurovascular structures) Using a 30 gauge needle, the area was anesthetized with 5 mL of local anesthetic mentioned above.  Using a 22 gauge 3.5 in needle under continuous ultrasound guidance, in plane technique, short axis to the knee joint, the suprapatellar bursa was aspirated of 10mL yellowish fluid, and then injected with above mentioned solution. [name] tolerated this injection well.  A band-aid dressing was applied.  Post injection instructions were given including applying a cool pack to the area of injection for 20 minutes on and 20 minutes off upon returning home and again tonight. The patient?s neurological examination remained stable at all times and the patient remained hemodynamically stable. If there are any complications, the patient was instructed to call us .  The patient is to follow-up with me within 4-6 weeks. I was present for the entirety of the procedure. Okay to restart physical therapy.  Call anytime for updated script.NDC Lidocaine  1%- [id number] Ropivacaine  0.2%- [id number] Kenalog - [id number] Brim, DOInterventional Spine and Musculoskeletal MedicinePhysical Medicine and RehabilitationYale Department of Orthopedics and Rehabilitation

## 2024-08-18 ENCOUNTER — Encounter: Admit: 2024-08-18 | Payer: PRIVATE HEALTH INSURANCE | Attending: Pain Medicine | Primary: Internal Medicine

## 2024-09-08 ENCOUNTER — Encounter: Admit: 2024-09-08 | Payer: PRIVATE HEALTH INSURANCE | Attending: Pain Medicine | Primary: Internal Medicine

## 2024-09-22 ENCOUNTER — Encounter: Admit: 2024-09-22 | Payer: PRIVATE HEALTH INSURANCE | Attending: Pain Medicine | Primary: Internal Medicine

## 2024-09-23 ENCOUNTER — Encounter: Admit: 2024-09-23 | Payer: PRIVATE HEALTH INSURANCE | Attending: Pain Medicine | Primary: Internal Medicine

## 2024-09-23 DIAGNOSIS — M25561 Pain in right knee: Principal | ICD-10-CM

## 2024-09-23 DIAGNOSIS — R29898 Other symptoms and signs involving the musculoskeletal system: Secondary | ICD-10-CM

## 2024-09-23 DIAGNOSIS — M7918 Myalgia, other site: Secondary | ICD-10-CM

## 2024-09-23 DIAGNOSIS — M170 Bilateral primary osteoarthritis of knee: Secondary | ICD-10-CM

## 2024-09-24 ENCOUNTER — Encounter: Admit: 2024-09-24 | Payer: PRIVATE HEALTH INSURANCE | Attending: Pain Medicine | Primary: Internal Medicine

## 2024-11-19 ENCOUNTER — Encounter: Admit: 2024-11-19 | Payer: PRIVATE HEALTH INSURANCE | Attending: Pain Medicine | Primary: Internal Medicine

## 2024-11-19 ENCOUNTER — Telehealth: Admit: 2024-11-19 | Payer: PRIVATE HEALTH INSURANCE | Attending: Pain Medicine | Primary: Internal Medicine

## 2024-11-19 DIAGNOSIS — M7918 Myalgia, other site: Secondary | ICD-10-CM

## 2024-11-19 DIAGNOSIS — M170 Bilateral primary osteoarthritis of knee: Secondary | ICD-10-CM

## 2024-11-19 DIAGNOSIS — R200 Anesthesia of skin: Secondary | ICD-10-CM

## 2024-11-19 DIAGNOSIS — M25561 Pain in right knee: Principal | ICD-10-CM

## 2024-11-19 DIAGNOSIS — E7800 Pure hypercholesterolemia, unspecified: Secondary | ICD-10-CM

## 2024-11-19 DIAGNOSIS — L905 Scar conditions and fibrosis of skin: Principal | ICD-10-CM

## 2024-11-19 DIAGNOSIS — T8859XA Other complications of anesthesia, initial encounter: Secondary | ICD-10-CM

## 2024-11-19 DIAGNOSIS — R29898 Other symptoms and signs involving the musculoskeletal system: Secondary | ICD-10-CM

## 2024-11-19 NOTE — Progress Notes [1]
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 [place] state to provide care for this visit. Other individuals present during the telehealth encounter and their role/relation: noneBecause this visit was completed over video, a hands-on physical exam was not performed. Patient/parent or guardian understands and knows to call back if condition changes. Impression & Plan: Listed aboveRobin Yuniel Blaney, DOInterventional Spine and Musculoskeletal MedicinePhysical Medicine and RehabilitationYale [facility] of [facility] Rehabilitation

## 2024-11-19 NOTE — Telephone Encounter [36]
 lvm that Dr. Dwight would like to set up a 3-4 month follow up and to call the care center at [phone] to schedule

## 2024-11-19 NOTE — Progress Notes [1]
"  Review of Systems   All other systems reviewed and are negative.    "

## 2024-11-26 ENCOUNTER — Encounter: Admit: 2024-11-26 | Payer: PRIVATE HEALTH INSURANCE | Attending: Pain Medicine | Primary: Internal Medicine
